# Patient Record
Sex: Male | Born: 1970 | Race: Black or African American | Hispanic: No | Marital: Married | Smoking: Never smoker
Health system: Southern US, Community
[De-identification: ages and names within clinical notes are randomized; demographics above are authoritative.]

## PROBLEM LIST (undated history)

## (undated) DIAGNOSIS — M549 Dorsalgia, unspecified: Secondary | ICD-10-CM

## (undated) DIAGNOSIS — F319 Bipolar disorder, unspecified: Secondary | ICD-10-CM

## (undated) DIAGNOSIS — F329 Major depressive disorder, single episode, unspecified: Secondary | ICD-10-CM

## (undated) DIAGNOSIS — N189 Chronic kidney disease, unspecified: Secondary | ICD-10-CM

## (undated) DIAGNOSIS — G8929 Other chronic pain: Secondary | ICD-10-CM

## (undated) DIAGNOSIS — F32A Depression, unspecified: Secondary | ICD-10-CM

## (undated) DIAGNOSIS — W3400XA Accidental discharge from unspecified firearms or gun, initial encounter: Secondary | ICD-10-CM

## (undated) DIAGNOSIS — I10 Essential (primary) hypertension: Secondary | ICD-10-CM

## (undated) DIAGNOSIS — F419 Anxiety disorder, unspecified: Secondary | ICD-10-CM

## (undated) DIAGNOSIS — E079 Disorder of thyroid, unspecified: Secondary | ICD-10-CM

## (undated) DIAGNOSIS — Y249XXA Unspecified firearm discharge, undetermined intent, initial encounter: Secondary | ICD-10-CM

## (undated) HISTORY — PX: KNEE SURGERY: SHX244

## (undated) HISTORY — PX: APPENDECTOMY: SHX54

## (undated) HISTORY — DX: Chronic kidney disease, unspecified: N18.9

## (undated) HISTORY — DX: Anxiety disorder, unspecified: F41.9

## (undated) HISTORY — DX: Bipolar disorder, unspecified: F31.9

## (undated) HISTORY — PX: OTHER SURGICAL HISTORY: SHX169

---

## 2012-06-20 ENCOUNTER — Emergency Department (HOSPITAL_COMMUNITY)
Admission: EM | Admit: 2012-06-20 | Discharge: 2012-06-20 | Disposition: A | Discharge status: Home / Self Care | Payer: BC Managed Care – HMO | Attending: Emergency Medicine | Admitting: Emergency Medicine

## 2012-06-20 ENCOUNTER — Encounter (HOSPITAL_COMMUNITY): Payer: Self-pay | Admitting: Emergency Medicine

## 2012-06-20 DIAGNOSIS — G8929 Other chronic pain: Secondary | ICD-10-CM

## 2012-06-20 DIAGNOSIS — K029 Dental caries, unspecified: Secondary | ICD-10-CM | POA: Insufficient documentation

## 2012-06-20 HISTORY — DX: Essential (primary) hypertension: I10

## 2012-06-20 MED ORDER — PENICILLIN V POTASSIUM 500 MG PO TABS: 500.0000 mg | ORAL_TABLET | Freq: Three times a day (TID) | ORAL | Status: AC

## 2012-06-20 MED ORDER — OXYCODONE-ACETAMINOPHEN 5-325 MG PO TABS: ORAL_TABLET | ORAL | Status: AC

## 2012-06-20 MED ORDER — OXYCODONE-ACETAMINOPHEN 5-325 MG PO TABS
2.0000 | ORAL_TABLET | Freq: Once | ORAL | Status: AC
  Administered 2012-06-20: 2 via ORAL
  Filled 2012-06-20: qty 2

## 2012-06-20 NOTE — ED Provider Notes (Signed)
History     CSN: [id number]  Arrival date & time 06/20/12  1023   First MD Initiated Contact with Patient 06/20/12 1058      Chief Complaint  Patient presents with  . Dental Pain    (Consider location/radiation/quality/duration/timing/severity/associated sxs/prior treatment) HPI  41 y.o. male in no acute distress complaining of exacerbation of chronic right lower tooth pain. Pain has been worsening over the course of 5 days. Denies fever gum swelling or purulent drainage. Pain is rated at 10 out of 10. He does report fever and chills starting this morning.  No past medical history on file.  No past surgical history on file.  No family history on file.  History  Substance Use Topics  . Smoking status: Not on file  . Smokeless tobacco: Not on file  . Alcohol Use: Not on file      Review of Systems  HENT: Positive for dental problem.   All other systems reviewed and are negative.    Allergies  Review of patient's allergies indicates no known allergies.  Home Medications   Current Outpatient Rx  Name Route Sig Dispense Refill  . DULOXETINE HCL 60 MG PO CPEP Oral Take 120 mg by mouth daily.      There were no vitals taken for this visit.  Physical Exam  Vitals reviewed. Constitutional: He is oriented to person, place, and time. He appears well-developed and well-nourished. No distress.  HENT:  Head: Normocephalic.  Mouth/Throat: Oropharynx is clear and moist.       Multiple dental caries and broken teeth. No erythema, swelling, or fluctuance the gums  Eyes: Conjunctivae and EOM are normal.  Cardiovascular: Normal rate.   Pulmonary/Chest: Effort normal.  Musculoskeletal: Normal range of motion.  Neurological: He is alert and oriented to person, place, and time.  Psychiatric: He has a normal mood and affect.    ED Course  Procedures (including critical care time)  Labs Reviewed - No data to display No results found.   1. Chronic dental pain        MDM  Patient with exacerbation of chronic dental pain I will give him Percocet and a resource list to followup in the dental clinic. Although you do not appreciate a phone and abscess I will start him on penicillin prophylactically. Pt verbalized understanding and agrees with care plan. Outpatient follow-up and return precautions given.           Wynetta Emery, PA-C 06/20/12 1142

## 2012-06-20 NOTE — ED Provider Notes (Signed)
Medical screening examination/treatment/procedure(s) were performed by non-physician practitioner and as supervising physician I was immediately available for consultation/collaboration.   Gwyneth Sprout, MD 06/20/12 1601

## 2012-07-12 ENCOUNTER — Emergency Department (HOSPITAL_COMMUNITY)
Admission: EM | Admit: 2012-07-12 | Discharge: 2012-07-12 | Disposition: A | Discharge status: Home / Self Care | Payer: Medicaid - Out of State | Attending: Emergency Medicine | Admitting: Emergency Medicine

## 2012-07-12 ENCOUNTER — Encounter (HOSPITAL_COMMUNITY): Payer: Self-pay | Admitting: Emergency Medicine

## 2012-07-12 DIAGNOSIS — I10 Essential (primary) hypertension: Secondary | ICD-10-CM | POA: Insufficient documentation

## 2012-07-12 DIAGNOSIS — K047 Periapical abscess without sinus: Secondary | ICD-10-CM | POA: Insufficient documentation

## 2012-07-12 DIAGNOSIS — Z8249 Family history of ischemic heart disease and other diseases of the circulatory system: Secondary | ICD-10-CM | POA: Insufficient documentation

## 2012-07-12 DIAGNOSIS — Z833 Family history of diabetes mellitus: Secondary | ICD-10-CM | POA: Insufficient documentation

## 2012-07-12 MED ORDER — TRAMADOL HCL 50 MG PO TABS: 50.0000 mg | ORAL_TABLET | Freq: Four times a day (QID) | ORAL | Status: AC | PRN

## 2012-07-12 MED ORDER — NAPROXEN 500 MG PO TABS: 500.0000 mg | ORAL_TABLET | Freq: Two times a day (BID) | ORAL | Status: DC

## 2012-07-12 MED ORDER — AMOXICILLIN 500 MG PO CAPS: 500.0000 mg | ORAL_CAPSULE | Freq: Three times a day (TID) | ORAL | Status: AC

## 2012-07-12 MED ORDER — KETOROLAC TROMETHAMINE 60 MG/2ML IM SOLN
60.0000 mg | Freq: Once | INTRAMUSCULAR | Status: AC
  Administered 2012-07-12: 60 mg via INTRAMUSCULAR
  Filled 2012-07-12: qty 2

## 2012-07-12 NOTE — ED Provider Notes (Signed)
History     CSN: [id number]  Arrival date & time 07/12/12  0037   First MD Initiated Contact with Patient 07/12/12 [phone]      Chief Complaint  Patient presents with  . Dental Pain    (Consider location/radiation/quality/duration/timing/severity/associated sxs/prior treatment) HPI Comments: Patient reports right jaw pain onset in the last several hours. He has history of poor dentition but has not had a toothache. He denies fevers chills nausea or vomiting. The symptoms are persistent, gradually worsening, not associated with neck pain or stiffness.  Patient is a 41 y.o. male presenting with tooth pain. The history is provided by the patient.  Dental PainPrimary symptoms do not include fever or sore throat.  Additional symptoms include: facial swelling. Additional symptoms do not include: trouble swallowing.    Past Medical History  Diagnosis Date  . Hypertension     History reviewed. No pertinent past surgical history.  Family History  Problem Relation Age of Onset  . Diabetes Mother   . Hypertension Mother     History  Substance Use Topics  . Smoking status: Never Smoker   . Smokeless tobacco: Not on file  . Alcohol Use: No      Review of Systems  Constitutional: Negative for fever and chills.  HENT: Positive for facial swelling and dental problem. Negative for sore throat, trouble swallowing and voice change.        Toothache  Gastrointestinal: Negative for nausea and vomiting.    Allergies  Review of patient's allergies indicates no known allergies.  Home Medications   Current Outpatient Rx  Name Route Sig Dispense Refill  . DULOXETINE HCL 60 MG PO CPEP Oral Take 120 mg by mouth 2 (two) times daily.     . IBUPROFEN 200 MG PO TABS Oral Take 800 mg by mouth every 8 (eight) hours as needed. For pain    . AMOXICILLIN 500 MG PO CAPS Oral Take 1 capsule (500 mg total) by mouth 3 (three) times daily. 30 capsule 0  . NAPROXEN 500 MG PO TABS Oral Take 1 tablet  (500 mg total) by mouth 2 (two) times daily with a meal. 30 tablet 0  . TRAMADOL HCL 50 MG PO TABS Oral Take 1 tablet (50 mg total) by mouth every 6 (six) hours as needed for pain. 15 tablet 0    BP 148/89  Pulse 71  Temp 98.1 F (36.7 C) (Oral)  Resp 20  Wt 187 lb (84.823 kg)  SpO2 100%  Physical Exam  Nursing note and vitals reviewed. Constitutional: He appears well-developed and well-nourished. No distress.  HENT:  Mouth/Throat: Oropharynx is clear and moist. No oropharyngeal exudate.       Tenderness over the right mandible, tenderness over the gums, no area of fluctuance palpated, no tenderness under the tongue, no asymmetry, normal voice and phonation, patent oropharynx without exudate asymmetry or hypertrophy of the tonsils.  Eyes: Conjunctivae and EOM are normal. Pupils are equal, round, and reactive to light. Right eye exhibits no discharge. Left eye exhibits no discharge. No scleral icterus.  Neck: Normal range of motion. Neck supple. No JVD present. No thyromegaly present.       Supple neck without lymphadenopathy or stiffness  Pulmonary/Chest: Effort normal.  Lymphadenopathy:    He has no cervical adenopathy.  Neurological: He is alert. Coordination normal.  Skin: Skin is warm and dry. No rash noted. No erythema.  Psychiatric: He has a normal mood and affect. His behavior is normal.  ED Course  Procedures (including critical care time)  Labs Reviewed - No data to display No results found.   1. Dental abscess       MDM  Patient has early dental abscess, with normal vital signs, no signs of Ludwig's angina, treat with antibiotics, pain medications followup with dental followup.  toradol given  Discharge Prescriptions include:  amox Naprosyn Ultram         Vida Roller, MD 07/12/12 [phone]

## 2012-07-12 NOTE — ED Notes (Signed)
Pt states for a month and a half he has been having a toothache  Pt states it is on the right bottom  Pt has swelling noted to the right side of his face

## 2012-09-16 ENCOUNTER — Emergency Department (HOSPITAL_COMMUNITY)
Admission: EM | Admit: 2012-09-16 | Discharge: 2012-09-16 | Disposition: A | Discharge status: Home / Self Care | Payer: Medicaid Other | Source: Home / Self Care

## 2012-09-16 ENCOUNTER — Emergency Department (HOSPITAL_COMMUNITY)
Admission: EM | Admit: 2012-09-16 | Discharge: 2012-09-16 | Disposition: A | Discharge status: Home / Self Care | Payer: Medicaid Other | Attending: Emergency Medicine | Admitting: Emergency Medicine

## 2012-09-16 ENCOUNTER — Encounter (HOSPITAL_COMMUNITY): Payer: Self-pay

## 2012-09-16 ENCOUNTER — Encounter (HOSPITAL_COMMUNITY): Payer: Self-pay | Admitting: Emergency Medicine

## 2012-09-16 DIAGNOSIS — Z7982 Long term (current) use of aspirin: Secondary | ICD-10-CM | POA: Insufficient documentation

## 2012-09-16 DIAGNOSIS — Z791 Long term (current) use of non-steroidal anti-inflammatories (NSAID): Secondary | ICD-10-CM | POA: Insufficient documentation

## 2012-09-16 DIAGNOSIS — G8921 Chronic pain due to trauma: Secondary | ICD-10-CM | POA: Insufficient documentation

## 2012-09-16 DIAGNOSIS — E079 Disorder of thyroid, unspecified: Secondary | ICD-10-CM | POA: Insufficient documentation

## 2012-09-16 DIAGNOSIS — M545 Low back pain, unspecified: Secondary | ICD-10-CM

## 2012-09-16 DIAGNOSIS — G8929 Other chronic pain: Secondary | ICD-10-CM

## 2012-09-16 DIAGNOSIS — Z79899 Other long term (current) drug therapy: Secondary | ICD-10-CM | POA: Insufficient documentation

## 2012-09-16 DIAGNOSIS — I10 Essential (primary) hypertension: Secondary | ICD-10-CM | POA: Insufficient documentation

## 2012-09-16 HISTORY — DX: Disorder of thyroid, unspecified: E07.9

## 2012-09-16 HISTORY — DX: Accidental discharge from unspecified firearms or gun, initial encounter: W34.00XA

## 2012-09-16 HISTORY — DX: Unspecified firearm discharge, undetermined intent, initial encounter: Y24.9XXA

## 2012-09-16 MED ORDER — TRAMADOL HCL 50 MG PO TABS: 50.0000 mg | ORAL_TABLET | Freq: Four times a day (QID) | ORAL | Status: DC | PRN

## 2012-09-16 MED ORDER — ACETAMINOPHEN-CODEINE #3 300-30 MG PO TABS
1.0000 | ORAL_TABLET | Freq: Four times a day (QID) | ORAL | Status: DC | PRN
Start: 2012-09-16 — End: 2013-02-09

## 2012-09-16 MED ORDER — OXYCODONE-ACETAMINOPHEN 5-325 MG PO TABS
2.0000 | ORAL_TABLET | Freq: Once | ORAL | Status: AC
  Administered 2012-09-16: 2 via ORAL
  Filled 2012-09-16: qty 2

## 2012-09-16 MED ORDER — OXYCODONE-ACETAMINOPHEN 10-325 MG PO TABS: 1.0000 | ORAL_TABLET | ORAL | Status: DC | PRN

## 2012-09-16 NOTE — ED Notes (Signed)
When in to d/c patient he became upset and told me to keep the tylenol 3# and tramadol Rx. Patient was advised to try to call the number provided to establish a relationship with a primary care provider , as to obtain help with his pain control until he can go to this clinic appointment later this month, as we do not do primary care.Was advised we have done all we are allowed to do . Pt left [facility] angry, and stated he intended to go to the ED for further evaluation and assistance.
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
## 2012-09-16 NOTE — ED Notes (Signed)
Requesting pain medication Rx. Reportedly had just moved here from [place], and has had issues w appointment for pain management here in town. States he has new appointment 11-14 w [facility]
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
## 2012-09-16 NOTE — ED Notes (Signed)
At time of d/c, patient was upset, raising his voice at this writer. " I don't want the tyenol #3, I don't want the tramadol
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
## 2012-09-16 NOTE — ED Notes (Signed)
Patient stated did not want previous discharge paper work or prescriptions from Urgent Care. Placed in Higher education careers adviser.

## 2012-09-17 NOTE — ED Provider Notes (Signed)
Medical screening examination/treatment/procedure(s) were performed by resident physician or non-physician practitioner and as supervising physician I was immediately available for consultation/collaboration.   Rashmi Tallent DOUGLAS MD.    Bailey Faiella D Gurjot Brisco, MD 09/17/12 0918 

## 2012-12-03 ENCOUNTER — Emergency Department (HOSPITAL_COMMUNITY)
Admission: EM | Admit: 2012-12-03 | Discharge: 2012-12-03 | Disposition: A | Discharge status: Home / Self Care | Payer: Medicaid Other | Attending: Emergency Medicine | Admitting: Emergency Medicine

## 2012-12-03 ENCOUNTER — Encounter (HOSPITAL_COMMUNITY): Payer: Self-pay | Admitting: *Deleted

## 2012-12-03 DIAGNOSIS — G8921 Chronic pain due to trauma: Secondary | ICD-10-CM | POA: Insufficient documentation

## 2012-12-03 DIAGNOSIS — M545 Low back pain, unspecified: Secondary | ICD-10-CM | POA: Insufficient documentation

## 2012-12-03 DIAGNOSIS — M549 Dorsalgia, unspecified: Secondary | ICD-10-CM

## 2012-12-03 DIAGNOSIS — E079 Disorder of thyroid, unspecified: Secondary | ICD-10-CM | POA: Insufficient documentation

## 2012-12-03 DIAGNOSIS — IMO0002 Reserved for concepts with insufficient information to code with codable children: Secondary | ICD-10-CM | POA: Insufficient documentation

## 2012-12-03 DIAGNOSIS — I10 Essential (primary) hypertension: Secondary | ICD-10-CM | POA: Insufficient documentation

## 2012-12-03 DIAGNOSIS — X58XXXS Exposure to other specified factors, sequela: Secondary | ICD-10-CM | POA: Insufficient documentation

## 2012-12-03 DIAGNOSIS — M5380 Other specified dorsopathies, site unspecified: Secondary | ICD-10-CM | POA: Insufficient documentation

## 2012-12-03 DIAGNOSIS — Z79899 Other long term (current) drug therapy: Secondary | ICD-10-CM | POA: Insufficient documentation

## 2012-12-03 DIAGNOSIS — G8929 Other chronic pain: Secondary | ICD-10-CM

## 2012-12-03 MED ORDER — OXYCODONE-ACETAMINOPHEN 10-325 MG PO TABS: 1.0000 | ORAL_TABLET | ORAL | Status: DC | PRN

## 2012-12-03 MED ORDER — OXYCODONE-ACETAMINOPHEN 5-325 MG PO TABS
2.0000 | ORAL_TABLET | Freq: Once | ORAL | Status: AC
  Administered 2012-12-03: 2 via ORAL
  Filled 2012-12-03: qty 2
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
## 2012-12-03 NOTE — ED Notes (Signed)
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

## 2012-12-03 NOTE — ED Notes (Signed)
Patient moved to area about 3 months ago, was seeing a pain management specialist in [place] and has continued to receive treatments there since moving to this area. Patient has been taking 30mg  Oxycodone QID and 120mg  Cymbalta BID, and was recently given 3 new medications (Flexeril and 2 additional medications he is unable to name). Patient states it has been a "hassle" to get treatment here due to insurance. Patient states he was unable to fill his medications that were most recently prescribed due to the costs. Patient states he has an appointment with a new PCP on 12/07/12.  Patient c/o low back pain.

## 2012-12-04 NOTE — ED Provider Notes (Signed)
Medical screening examination/treatment/procedure(s) were performed by non-physician practitioner and as supervising physician I was immediately available for consultation/collaboration.   Zarielle Cea L Lakeyn Dokken, MD 12/04/12 0038 

## 2013-02-09 ENCOUNTER — Emergency Department (HOSPITAL_COMMUNITY)
Admission: EM | Admit: 2013-02-09 | Discharge: 2013-02-09 | Disposition: A | Discharge status: Home / Self Care | Payer: Medicaid Other | Attending: Emergency Medicine | Admitting: Emergency Medicine

## 2013-02-09 ENCOUNTER — Encounter (HOSPITAL_COMMUNITY): Payer: Self-pay | Admitting: *Deleted

## 2013-02-09 DIAGNOSIS — T783XXA Angioneurotic edema, initial encounter: Secondary | ICD-10-CM

## 2013-02-09 DIAGNOSIS — T465X5A Adverse effect of other antihypertensive drugs, initial encounter: Secondary | ICD-10-CM | POA: Insufficient documentation

## 2013-02-09 DIAGNOSIS — Z87828 Personal history of other (healed) physical injury and trauma: Secondary | ICD-10-CM | POA: Insufficient documentation

## 2013-02-09 DIAGNOSIS — I10 Essential (primary) hypertension: Secondary | ICD-10-CM | POA: Insufficient documentation

## 2013-02-09 DIAGNOSIS — E079 Disorder of thyroid, unspecified: Secondary | ICD-10-CM | POA: Insufficient documentation

## 2013-02-09 DIAGNOSIS — R220 Localized swelling, mass and lump, head: Secondary | ICD-10-CM | POA: Insufficient documentation

## 2013-02-09 DIAGNOSIS — Z79899 Other long term (current) drug therapy: Secondary | ICD-10-CM | POA: Insufficient documentation

## 2013-02-09 DIAGNOSIS — R21 Rash and other nonspecific skin eruption: Secondary | ICD-10-CM | POA: Insufficient documentation

## 2013-02-09 MED ORDER — SODIUM CHLORIDE 0.9 % IV BOLUS (SEPSIS)
500.0000 mL | Freq: Once | INTRAVENOUS | Status: AC
  Administered 2013-02-09: 500 mL via INTRAVENOUS

## 2013-02-09 MED ORDER — FAMOTIDINE IN NACL 20-0.9 MG/50ML-% IV SOLN
20.0000 mg | Freq: Once | INTRAVENOUS | Status: AC
  Administered 2013-02-09: 20 mg via INTRAVENOUS
  Filled 2013-02-09: qty 50

## 2013-02-09 MED ORDER — PREDNISONE 20 MG PO TABS
60.0000 mg | ORAL_TABLET | Freq: Once | ORAL | Status: AC
  Administered 2013-02-09: 60 mg via ORAL
  Filled 2013-02-09: qty 3

## 2013-02-09 MED ORDER — DIPHENHYDRAMINE HCL 50 MG/ML IJ SOLN
25.0000 mg | Freq: Once | INTRAMUSCULAR | Status: AC
Start: 2013-02-09 — End: 2013-02-09
  Administered 2013-02-09: 25 mg via INTRAVENOUS
  Filled 2013-02-09: qty 1

## 2013-02-09 MED ORDER — SODIUM CHLORIDE 0.9 % IV SOLN: INTRAVENOUS | Status: DC

## 2013-02-09 NOTE — ED Provider Notes (Signed)
Medical screening examination/treatment/procedure(s) were performed by non-physician practitioner and as supervising physician I was immediately available for consultation/collaboration.   David H Yao, MD 02/09/13 2055 
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
## 2013-02-09 NOTE — ED Notes (Signed)
Pt states started a new blood pressure medication on Monday, states this morning started having swelling in his lips, took benadryl, did not help, states lips and mouth is swollen, states mouth is very painful, denies difficulty breathing. Pt refusing to stick tongue out d/t pain.

## 2013-04-12 ENCOUNTER — Other Ambulatory Visit: Payer: Self-pay | Admitting: Pain Medicine

## 2013-04-12 DIAGNOSIS — M545 Low back pain, unspecified: Secondary | ICD-10-CM

## 2013-04-18 ENCOUNTER — Other Ambulatory Visit: Payer: BC Managed Care – HMO

## 2013-04-21 ENCOUNTER — Inpatient Hospital Stay
Admission: RE | Admit: 2013-04-21 | Discharge: 2013-04-21 | Disposition: A | Discharge status: Home / Self Care | Payer: BC Managed Care – HMO | Source: Ambulatory Visit | Attending: Pain Medicine | Admitting: Pain Medicine

## 2013-04-27 ENCOUNTER — Ambulatory Visit
Admission: RE | Admit: 2013-04-27 | Discharge: 2013-04-27 | Disposition: A | Discharge status: Home / Self Care | Payer: Medicaid Other | Source: Ambulatory Visit | Attending: Pain Medicine | Admitting: Pain Medicine

## 2013-04-27 DIAGNOSIS — M545 Low back pain, unspecified: Secondary | ICD-10-CM

## 2014-10-22 DIAGNOSIS — G8929 Other chronic pain: Secondary | ICD-10-CM | POA: Insufficient documentation

## 2015-04-14 ENCOUNTER — Emergency Department (HOSPITAL_COMMUNITY)
Admission: EM | Admit: 2015-04-14 | Discharge: 2015-04-14 | Discharge status: Against Medical Advice | Payer: Medicaid Other | Source: Home / Self Care

## 2015-04-14 ENCOUNTER — Encounter (HOSPITAL_COMMUNITY): Payer: Self-pay | Admitting: Emergency Medicine

## 2015-04-14 ENCOUNTER — Emergency Department (HOSPITAL_COMMUNITY)
Admission: EM | Admit: 2015-04-14 | Discharge: 2015-04-14 | Disposition: A | Discharge status: Home / Self Care | Payer: Medicaid Other | Attending: Emergency Medicine | Admitting: Emergency Medicine

## 2015-04-14 ENCOUNTER — Encounter (HOSPITAL_COMMUNITY): Payer: Self-pay | Admitting: *Deleted

## 2015-04-14 DIAGNOSIS — G8929 Other chronic pain: Secondary | ICD-10-CM | POA: Insufficient documentation

## 2015-04-14 DIAGNOSIS — I10 Essential (primary) hypertension: Secondary | ICD-10-CM

## 2015-04-14 DIAGNOSIS — Z79899 Other long term (current) drug therapy: Secondary | ICD-10-CM | POA: Insufficient documentation

## 2015-04-14 DIAGNOSIS — Z8639 Personal history of other endocrine, nutritional and metabolic disease: Secondary | ICD-10-CM | POA: Diagnosis not present

## 2015-04-14 DIAGNOSIS — R61 Generalized hyperhidrosis: Secondary | ICD-10-CM | POA: Insufficient documentation

## 2015-04-14 DIAGNOSIS — Z87828 Personal history of other (healed) physical injury and trauma: Secondary | ICD-10-CM | POA: Insufficient documentation

## 2015-04-14 DIAGNOSIS — M546 Pain in thoracic spine: Secondary | ICD-10-CM | POA: Insufficient documentation

## 2015-04-14 DIAGNOSIS — R1084 Generalized abdominal pain: Secondary | ICD-10-CM | POA: Diagnosis not present

## 2015-04-14 DIAGNOSIS — M549 Dorsalgia, unspecified: Secondary | ICD-10-CM

## 2015-04-14 DIAGNOSIS — M545 Low back pain: Secondary | ICD-10-CM | POA: Insufficient documentation

## 2015-04-14 MED ORDER — KETOROLAC TROMETHAMINE 60 MG/2ML IM SOLN
60.0000 mg | Freq: Once | INTRAMUSCULAR | Status: AC
  Administered 2015-04-14: 60 mg via INTRAMUSCULAR
  Filled 2015-04-14: qty 2

## 2015-04-14 MED ORDER — METHOCARBAMOL 500 MG PO TABS: 500.0000 mg | ORAL_TABLET | Freq: Two times a day (BID) | ORAL | Status: DC

## 2015-04-14 MED ORDER — MELOXICAM 15 MG PO TABS: 15.0000 mg | ORAL_TABLET | Freq: Every day | ORAL | Status: DC

## 2015-04-14 MED ORDER — HYDROMORPHONE HCL 1 MG/ML IJ SOLN
1.0000 mg | Freq: Once | INTRAMUSCULAR | Status: AC
  Administered 2015-04-14: 1 mg via INTRAMUSCULAR
  Filled 2015-04-14: qty 1

## 2015-04-14 MED ORDER — HYDROMORPHONE HCL 2 MG/ML IJ SOLN
2.0000 mg | Freq: Once | INTRAMUSCULAR | Status: DC
  Filled 2015-04-14: qty 1

## 2015-04-14 MED ORDER — METHOCARBAMOL 500 MG PO TABS
500.0000 mg | ORAL_TABLET | Freq: Once | ORAL | Status: AC
  Administered 2015-04-14: 500 mg via ORAL
  Filled 2015-04-14: qty 1

## 2015-04-14 NOTE — Discharge Instructions (Signed)
Follow up with your doctor for further evaluation of your pain.

## 2015-04-14 NOTE — ED Notes (Signed)
Pt left [facility] after triage. He did not inform the RN he was leaving. [facility] stated we have to d/c him out of our system so they can admit him there. Will d/c pt out at this time.
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
## 2015-08-29 ENCOUNTER — Emergency Department (HOSPITAL_COMMUNITY)
Admission: EM | Admit: 2015-08-29 | Discharge: 2015-08-30 | Disposition: A | Discharge status: Other Acute Inpatient Hospital | Payer: Medicaid Other | Attending: Emergency Medicine | Admitting: Emergency Medicine

## 2015-08-29 ENCOUNTER — Encounter (HOSPITAL_COMMUNITY): Payer: Self-pay

## 2015-08-29 DIAGNOSIS — Z79899 Other long term (current) drug therapy: Secondary | ICD-10-CM | POA: Diagnosis not present

## 2015-08-29 DIAGNOSIS — I10 Essential (primary) hypertension: Secondary | ICD-10-CM | POA: Diagnosis not present

## 2015-08-29 DIAGNOSIS — F329 Major depressive disorder, single episode, unspecified: Secondary | ICD-10-CM | POA: Insufficient documentation

## 2015-08-29 DIAGNOSIS — Z87828 Personal history of other (healed) physical injury and trauma: Secondary | ICD-10-CM | POA: Diagnosis not present

## 2015-08-29 DIAGNOSIS — R45851 Suicidal ideations: Secondary | ICD-10-CM

## 2015-08-29 DIAGNOSIS — T1491 Suicide attempt: Secondary | ICD-10-CM | POA: Diagnosis present

## 2015-08-29 DIAGNOSIS — G8929 Other chronic pain: Secondary | ICD-10-CM | POA: Insufficient documentation

## 2015-08-29 DIAGNOSIS — Z8639 Personal history of other endocrine, nutritional and metabolic disease: Secondary | ICD-10-CM | POA: Diagnosis not present

## 2015-08-29 DIAGNOSIS — Z791 Long term (current) use of non-steroidal anti-inflammatories (NSAID): Secondary | ICD-10-CM | POA: Insufficient documentation

## 2015-08-29 HISTORY — DX: Dorsalgia, unspecified: M54.9

## 2015-08-29 HISTORY — DX: Major depressive disorder, single episode, unspecified: F32.9

## 2015-08-29 HISTORY — DX: Depression, unspecified: F32.A

## 2015-08-29 HISTORY — DX: Other chronic pain: G89.29

## 2015-08-29 LAB — SALICYLATE LEVEL: Salicylate Lvl: <4 mg/dL (ref 2.8–30.0)

## 2015-08-29 LAB — COMPREHENSIVE METABOLIC PANEL
ALBUMIN: 5.1 g/dL — AB (ref 3.5–5.0)
ALK PHOS: 62 U/L (ref 38–126)
ALT: 16 U/L — AB (ref 17–63)
AST: 35 U/L (ref 15–41)
Anion gap: 10 (ref 5–15)
BILIRUBIN TOTAL: 0.7 mg/dL (ref 0.3–1.2)
BUN: 16 mg/dL (ref 6–20)
CO2: 28 mmol/L (ref 22–32)
CREATININE: 1.52 mg/dL — AB (ref 0.61–1.24)
Calcium: 10.3 mg/dL (ref 8.9–10.3)
Chloride: 104 mmol/L (ref 101–111)
GFR calc Af Amer: >60 mL/min (ref >60)
GFR, EST NON AFRICAN AMERICAN: 55 mL/min — AB (ref >60)
GLUCOSE: 144 mg/dL — AB (ref 65–99)
Potassium: 4.2 mmol/L (ref 3.5–5.1)
Sodium: 142 mmol/L (ref 135–145)
TOTAL PROTEIN: 9.5 g/dL — AB (ref 6.5–8.1)

## 2015-08-29 LAB — RAPID URINE DRUG SCREEN, HOSP PERFORMED
AMPHETAMINES: NOT DETECTED
BARBITURATES: NOT DETECTED
BENZODIAZEPINES: POSITIVE — AB
COCAINE: NOT DETECTED
Opiates: NOT DETECTED
Tetrahydrocannabinol: POSITIVE — AB

## 2015-08-29 LAB — CBC
HEMATOCRIT: 42.8 % (ref 39.0–52.0)
HEMOGLOBIN: 14.3 g/dL (ref 13.0–17.0)
MCH: 23.7 pg — ABNORMAL LOW (ref 26.0–34.0)
MCHC: 33.4 g/dL (ref 30.0–36.0)
MCV: 70.9 fL — ABNORMAL LOW (ref 78.0–100.0)
Platelets: 326 10*3/uL (ref 150–400)
RBC: 6.04 MIL/uL — AB (ref 4.22–5.81)
RDW: 16 % — AB (ref 11.5–15.5)
WBC: 13.4 10*3/uL — AB (ref 4.0–10.5)

## 2015-08-29 LAB — ACETAMINOPHEN LEVEL

## 2015-08-29 LAB — ETHANOL: Alcohol, Ethyl (B): <5 mg/dL (ref <5)

## 2015-08-29 MED ORDER — MIRTAZAPINE 30 MG PO TABS
30.0000 mg | ORAL_TABLET | Freq: Every day | ORAL | Status: DC
  Administered 2015-08-29: 30 mg via ORAL
  Filled 2015-08-29: qty 1

## 2015-08-29 MED ORDER — METHOCARBAMOL 500 MG PO TABS
500.0000 mg | ORAL_TABLET | Freq: Once | ORAL | Status: AC
  Administered 2015-08-29: 500 mg via ORAL
  Filled 2015-08-29: qty 1

## 2015-08-29 MED ORDER — NICOTINE 21 MG/24HR TD PT24: 21.0000 mg | MEDICATED_PATCH | Freq: Every day | TRANSDERMAL | Status: DC

## 2015-08-29 MED ORDER — IBUPROFEN 200 MG PO TABS
600.0000 mg | ORAL_TABLET | Freq: Three times a day (TID) | ORAL | Status: DC | PRN
  Administered 2015-08-29 – 2015-08-30 (×2): 600 mg via ORAL
  Filled 2015-08-29 (×2): qty 3

## 2015-08-29 MED ORDER — ALUM & MAG HYDROXIDE-SIMETH 200-200-20 MG/5ML PO SUSP: 30.0000 mL | ORAL | Status: DC | PRN

## 2015-08-29 MED ORDER — ACETAMINOPHEN 325 MG PO TABS
650.0000 mg | ORAL_TABLET | ORAL | Status: DC | PRN
  Administered 2015-08-30: 650 mg via ORAL
  Filled 2015-08-29: qty 2

## 2015-08-29 MED ORDER — LORAZEPAM 1 MG PO TABS
1.0000 mg | ORAL_TABLET | Freq: Three times a day (TID) | ORAL | Status: DC | PRN
  Administered 2015-08-29 – 2015-08-30 (×2): 1 mg via ORAL
  Filled 2015-08-29 (×2): qty 1

## 2015-08-29 NOTE — ED Notes (Signed)
Family has patient's belongings. 
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
## 2015-08-29 NOTE — BH Assessment (Signed)
Consulted with Assunta FoundShuvon Rankin, NP who recommends inpatient treatment at this time. Informed EDP and patients nurse.   Davina PokeJoVea Meziah Blasingame, LCSW Therapeutic Triage Specialist [facility] 08/29/2015 4:17 PM
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
## 2015-08-29 NOTE — BH Assessment (Signed)
Spoke with EDP who states that he will fill out proper paperwork to IVC patient.

## 2015-08-29 NOTE — ED Notes (Addendum)
Patient reports prior to coming to [place], he was taking Cymbalta, Lyrica, Naproxen, Oxycodone, and Flexeril. Does not feel that Remeron, which he states he Started in January, is helping him.

## 2015-08-29 NOTE — ED Notes (Signed)
Pt refusing to change into paper scrubs.  Stated "I will if the doctor tells me to.  I know what people think about you in those scrubs.  I came here by myself.  They didn't make me."  [employer] remains @ BS.

## 2015-08-29 NOTE — ED Notes (Signed)
Bed: WBH36 Expected date:  Expected time:  Means of arrival:  Comments: Triage 4 
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
## 2015-08-29 NOTE — Progress Notes (Signed)
Patient [facility] referral for review. [facility] - [facility] Diane, fax referral for review. [facility] - per Darl PikesSusan, d/cs on 10/21, fax referral for tomorrow. [facility] - per intake, fax referral for review.  [facility], [facility], and [facility]'t [facility] adult [facility].  [facility]: [facility] - per Olga MillersLary, call Darlene at 8:30am tomorrow, "Big day for discharges tomorrow". [facility]  [facility] [place], [place] Disposition staff 08/29/2015 6:26 PM

## 2015-08-29 NOTE — ED Notes (Signed)
Patient appears drowsy. Continues to complain of pain.
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
## 2015-08-29 NOTE — ED Notes (Signed)
EDP reports that he would like [employer] to stay and speak w/ TTS.

## 2015-08-29 NOTE — ED Notes (Signed)
EDP reports that he will be filling out IVC paperwork.
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
## 2015-08-29 NOTE — ED Notes (Signed)
Per [employer], tried to hang himself today-states history of Bi-polar and depression--per EMS he denies SI-recent death in family

## 2015-08-29 NOTE — ED Notes (Signed)
TTS at bedside. 

## 2015-08-29 NOTE — ED Notes (Signed)
[facility] contacted reports pt has rx for remeron 30 mg at bedtime and ibuprofen 800mg  filled on 04/21/15.  Tramadol 50 mg filled in 5/16. Lopressor 50mg  bid filled 3/16.
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
## 2015-08-30 MED ORDER — METHOCARBAMOL 500 MG PO TABS
500.0000 mg | ORAL_TABLET | Freq: Once | ORAL | Status: AC
  Administered 2015-08-30: 500 mg via ORAL
  Filled 2015-08-30: qty 1

## 2015-08-30 NOTE — ED Notes (Signed)
Pt Discharged [employer] with [employer].  Pt denied SI and HI.  Pt was limping due to chronic back pain but was in no acute distress.

## 2015-08-30 NOTE — BH Assessment (Signed)
Pt has been accepted to [facility] to the services of Dr. Enedina FinnerGoli. Nurse to nurse report # [phone]. Pt can be transported after 7am.

## 2015-08-30 NOTE — ED Notes (Signed)
Alert. Asking for additional blankets and what else is available for his pain.

## 2016-11-24 DIAGNOSIS — F3130 Bipolar disorder, current episode depressed, mild or moderate severity, unspecified: Secondary | ICD-10-CM | POA: Diagnosis not present

## 2016-11-24 DIAGNOSIS — Z79899 Other long term (current) drug therapy: Secondary | ICD-10-CM | POA: Diagnosis not present

## 2016-11-24 DIAGNOSIS — F4312 Post-traumatic stress disorder, chronic: Secondary | ICD-10-CM | POA: Diagnosis not present

## 2016-12-08 DIAGNOSIS — F4312 Post-traumatic stress disorder, chronic: Secondary | ICD-10-CM | POA: Diagnosis not present

## 2016-12-08 DIAGNOSIS — F3130 Bipolar disorder, current episode depressed, mild or moderate severity, unspecified: Secondary | ICD-10-CM | POA: Diagnosis not present

## 2016-12-08 DIAGNOSIS — R110 Nausea: Secondary | ICD-10-CM | POA: Diagnosis not present

## 2017-08-26 ENCOUNTER — Emergency Department (HOSPITAL_COMMUNITY)
Admission: EM | Admit: 2017-08-26 | Discharge: 2017-08-26 | Disposition: A | Discharge status: Home / Self Care | Payer: Medicaid Other | Attending: Emergency Medicine | Admitting: Emergency Medicine

## 2017-08-26 ENCOUNTER — Encounter (HOSPITAL_COMMUNITY): Payer: Self-pay

## 2017-08-26 ENCOUNTER — Emergency Department (HOSPITAL_COMMUNITY): Payer: Medicaid Other

## 2017-08-26 DIAGNOSIS — W3400XS Accidental discharge from unspecified firearms or gun, sequela: Secondary | ICD-10-CM | POA: Insufficient documentation

## 2017-08-26 DIAGNOSIS — G8929 Other chronic pain: Secondary | ICD-10-CM | POA: Diagnosis not present

## 2017-08-26 DIAGNOSIS — I10 Essential (primary) hypertension: Secondary | ICD-10-CM | POA: Insufficient documentation

## 2017-08-26 DIAGNOSIS — M545 Low back pain: Secondary | ICD-10-CM | POA: Insufficient documentation

## 2017-08-26 DIAGNOSIS — Z79899 Other long term (current) drug therapy: Secondary | ICD-10-CM | POA: Diagnosis not present

## 2017-08-26 DIAGNOSIS — R202 Paresthesia of skin: Secondary | ICD-10-CM | POA: Diagnosis not present

## 2017-08-26 DIAGNOSIS — R200 Anesthesia of skin: Secondary | ICD-10-CM | POA: Insufficient documentation

## 2017-08-26 LAB — CBC WITH DIFFERENTIAL/PLATELET
Basophils Absolute: 0 10*3/uL (ref 0.0–0.1)
Basophils Relative: 1 %
EOS PCT: 2 %
Eosinophils Absolute: 0.1 10*3/uL (ref 0.0–0.7)
HCT: 33.4 % — ABNORMAL LOW (ref 39.0–52.0)
Hemoglobin: 11.1 g/dL — ABNORMAL LOW (ref 13.0–17.0)
Lymphocytes Relative: 28 %
Lymphs Abs: 2.1 10*3/uL (ref 0.7–4.0)
MCH: 23.4 pg — AB (ref 26.0–34.0)
MCHC: 33.2 g/dL (ref 30.0–36.0)
MCV: 70.5 fL — AB (ref 78.0–100.0)
MONO ABS: 0.5 10*3/uL (ref 0.1–1.0)
MONOS PCT: 7 %
Neutro Abs: 4.7 10*3/uL (ref 1.7–7.7)
Neutrophils Relative %: 62 %
PLATELETS: 262 10*3/uL (ref 150–400)
RBC: 4.74 MIL/uL (ref 4.22–5.81)
RDW: 15.7 % — AB (ref 11.5–15.5)
WBC: 7.5 10*3/uL (ref 4.0–10.5)

## 2017-08-26 LAB — BASIC METABOLIC PANEL
Anion gap: 7 (ref 5–15)
BUN: 11 mg/dL (ref 6–20)
CO2: 26 mmol/L (ref 22–32)
Calcium: 9 mg/dL (ref 8.9–10.3)
Chloride: 105 mmol/L (ref 101–111)
Creatinine, Ser: 1.22 mg/dL (ref 0.61–1.24)
GFR calc Af Amer: >60 mL/min (ref >60)
GLUCOSE: 92 mg/dL (ref 65–99)
Potassium: 3.9 mmol/L (ref 3.5–5.1)
Sodium: 138 mmol/L (ref 135–145)

## 2017-08-26 MED ORDER — CYCLOBENZAPRINE HCL 10 MG PO TABS: 10.0000 mg | ORAL_TABLET | Freq: Two times a day (BID) | ORAL | 0 refills | Status: DC | PRN

## 2017-08-26 MED ORDER — IOPAMIDOL (ISOVUE-300) INJECTION 61%
100.0000 mL | Freq: Once | INTRAVENOUS | Status: AC | PRN
  Administered 2017-08-26: 100 mL via INTRAVENOUS

## 2017-08-26 MED ORDER — LIDOCAINE 5 % EX PTCH: 1.0000 | MEDICATED_PATCH | CUTANEOUS | 0 refills | Status: DC

## 2017-08-26 MED ORDER — MORPHINE SULFATE (PF) 4 MG/ML IV SOLN
4.0000 mg | Freq: Once | INTRAVENOUS | Status: AC
  Administered 2017-08-26: 4 mg via INTRAVENOUS
  Filled 2017-08-26: qty 1

## 2017-08-26 MED ORDER — IOPAMIDOL (ISOVUE-300) INJECTION 61%
INTRAVENOUS | Status: AC
  Filled 2017-08-26: qty 100

## 2017-08-26 MED ORDER — OXYCODONE-ACETAMINOPHEN 5-325 MG PO TABS
1.0000 | ORAL_TABLET | Freq: Once | ORAL | Status: AC
  Administered 2017-08-26: 1 via ORAL
  Filled 2017-08-26: qty 1
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
## 2018-05-17 DIAGNOSIS — F4312 Post-traumatic stress disorder, chronic: Secondary | ICD-10-CM | POA: Diagnosis not present

## 2018-05-17 DIAGNOSIS — F310 Bipolar disorder, current episode hypomanic: Secondary | ICD-10-CM | POA: Diagnosis not present

## 2018-05-31 DIAGNOSIS — F310 Bipolar disorder, current episode hypomanic: Secondary | ICD-10-CM | POA: Diagnosis not present

## 2018-05-31 DIAGNOSIS — F4312 Post-traumatic stress disorder, chronic: Secondary | ICD-10-CM | POA: Diagnosis not present

## 2018-05-31 DIAGNOSIS — R030 Elevated blood-pressure reading, without diagnosis of hypertension: Secondary | ICD-10-CM | POA: Diagnosis not present

## 2018-05-31 DIAGNOSIS — N521 Erectile dysfunction due to diseases classified elsewhere: Secondary | ICD-10-CM | POA: Diagnosis not present

## 2018-06-17 DIAGNOSIS — E559 Vitamin D deficiency, unspecified: Secondary | ICD-10-CM | POA: Diagnosis not present

## 2018-06-17 DIAGNOSIS — I10 Essential (primary) hypertension: Secondary | ICD-10-CM | POA: Diagnosis not present

## 2018-06-17 DIAGNOSIS — E611 Iron deficiency: Secondary | ICD-10-CM | POA: Diagnosis not present

## 2018-06-17 DIAGNOSIS — R634 Abnormal weight loss: Secondary | ICD-10-CM | POA: Diagnosis not present

## 2018-07-07 DIAGNOSIS — F4312 Post-traumatic stress disorder, chronic: Secondary | ICD-10-CM | POA: Diagnosis not present

## 2018-07-07 DIAGNOSIS — R21 Rash and other nonspecific skin eruption: Secondary | ICD-10-CM | POA: Diagnosis not present

## 2018-07-26 DIAGNOSIS — Z6824 Body mass index (BMI) 24.0-24.9, adult: Secondary | ICD-10-CM | POA: Diagnosis not present

## 2018-07-26 DIAGNOSIS — F310 Bipolar disorder, current episode hypomanic: Secondary | ICD-10-CM | POA: Diagnosis not present

## 2018-07-26 DIAGNOSIS — F4312 Post-traumatic stress disorder, chronic: Secondary | ICD-10-CM | POA: Diagnosis not present

## 2018-08-09 DIAGNOSIS — F4312 Post-traumatic stress disorder, chronic: Secondary | ICD-10-CM | POA: Diagnosis not present

## 2018-08-09 DIAGNOSIS — F310 Bipolar disorder, current episode hypomanic: Secondary | ICD-10-CM | POA: Diagnosis not present

## 2018-08-09 DIAGNOSIS — Z6824 Body mass index (BMI) 24.0-24.9, adult: Secondary | ICD-10-CM | POA: Diagnosis not present

## 2018-09-23 DIAGNOSIS — F310 Bipolar disorder, current episode hypomanic: Secondary | ICD-10-CM | POA: Diagnosis not present

## 2018-09-23 DIAGNOSIS — Z79899 Other long term (current) drug therapy: Secondary | ICD-10-CM | POA: Diagnosis not present

## 2018-09-26 DIAGNOSIS — Z79899 Other long term (current) drug therapy: Secondary | ICD-10-CM | POA: Diagnosis not present

## 2018-11-10 ENCOUNTER — Emergency Department (HOSPITAL_COMMUNITY)
Admission: EM | Admit: 2018-11-10 | Discharge: 2018-11-10 | Disposition: A | Discharge status: Home / Self Care | Payer: Medicaid Other | Attending: Emergency Medicine | Admitting: Emergency Medicine

## 2018-11-10 ENCOUNTER — Other Ambulatory Visit: Payer: Self-pay

## 2018-11-10 DIAGNOSIS — M545 Low back pain: Secondary | ICD-10-CM | POA: Diagnosis not present

## 2018-11-10 DIAGNOSIS — G8929 Other chronic pain: Secondary | ICD-10-CM

## 2018-11-10 DIAGNOSIS — I10 Essential (primary) hypertension: Secondary | ICD-10-CM | POA: Diagnosis not present

## 2018-11-10 DIAGNOSIS — Z79899 Other long term (current) drug therapy: Secondary | ICD-10-CM | POA: Insufficient documentation

## 2018-11-10 MED ORDER — LIDOCAINE 5 % EX PTCH: 1.0000 | MEDICATED_PATCH | CUTANEOUS | Status: DC

## 2018-11-10 MED ORDER — MELOXICAM 7.5 MG PO TABS: 7.5000 mg | ORAL_TABLET | Freq: Every day | ORAL | 0 refills | Status: DC
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
## 2018-11-10 NOTE — Discharge Instructions (Addendum)
Apply topical lidocaine patches to area for local pain relief. Take Tylenol and Meloxicam for pain as needed not relieved by topical pain reliever.   Follow up with general surgery to schedule removal of the foreign body in your left buttock area.

## 2018-11-10 NOTE — ED Notes (Signed)
When going to discharge patient he stated he thought he had something for pain. Verified that there was an order for Lidoderm patch. Informed patient I would go get the Lidoderm patch, he refused. Requesting something else for pain. Spoke with provider, who stated she could not write for a script of narcotics for this chronic pain. Relayed the information back to the patient, he got even more upset. Stating this is how black people are treated and how he was going to talk to the news or radio about his care. Also, he stated he would have wanted to have some Tylenol or Motrin. Informed patient I could ask for this order, but he stated he only wanted his papers and to leave.
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
## 2018-12-21 DIAGNOSIS — S20459A Superficial foreign body of unspecified back wall of thorax, initial encounter: Secondary | ICD-10-CM | POA: Diagnosis not present

## 2018-12-27 DIAGNOSIS — F320 Major depressive disorder, single episode, mild: Secondary | ICD-10-CM | POA: Diagnosis not present

## 2018-12-27 DIAGNOSIS — D582 Other hemoglobinopathies: Secondary | ICD-10-CM | POA: Diagnosis not present

## 2018-12-27 DIAGNOSIS — E782 Mixed hyperlipidemia: Secondary | ICD-10-CM | POA: Diagnosis not present

## 2018-12-27 DIAGNOSIS — F310 Bipolar disorder, current episode hypomanic: Secondary | ICD-10-CM | POA: Diagnosis not present

## 2019-03-10 DIAGNOSIS — E039 Hypothyroidism, unspecified: Secondary | ICD-10-CM | POA: Diagnosis not present

## 2019-03-14 DIAGNOSIS — I10 Essential (primary) hypertension: Secondary | ICD-10-CM | POA: Diagnosis not present

## 2019-03-14 DIAGNOSIS — Z79899 Other long term (current) drug therapy: Secondary | ICD-10-CM | POA: Diagnosis not present

## 2019-03-28 DIAGNOSIS — F332 Major depressive disorder, recurrent severe without psychotic features: Secondary | ICD-10-CM | POA: Diagnosis not present

## 2019-06-27 DIAGNOSIS — F310 Bipolar disorder, current episode hypomanic: Secondary | ICD-10-CM | POA: Diagnosis not present

## 2019-06-28 DIAGNOSIS — H20012 Primary iridocyclitis, left eye: Secondary | ICD-10-CM | POA: Diagnosis not present

## 2019-07-13 DIAGNOSIS — I10 Essential (primary) hypertension: Secondary | ICD-10-CM | POA: Diagnosis not present

## 2019-07-13 DIAGNOSIS — E039 Hypothyroidism, unspecified: Secondary | ICD-10-CM | POA: Diagnosis not present

## 2019-08-02 DIAGNOSIS — E785 Hyperlipidemia, unspecified: Secondary | ICD-10-CM | POA: Diagnosis not present

## 2019-08-02 DIAGNOSIS — F319 Bipolar disorder, unspecified: Secondary | ICD-10-CM | POA: Diagnosis not present

## 2019-08-02 DIAGNOSIS — R634 Abnormal weight loss: Secondary | ICD-10-CM | POA: Diagnosis not present

## 2019-08-02 DIAGNOSIS — F329 Major depressive disorder, single episode, unspecified: Secondary | ICD-10-CM | POA: Diagnosis not present

## 2019-08-02 DIAGNOSIS — N183 Chronic kidney disease, stage 3 (moderate): Secondary | ICD-10-CM | POA: Diagnosis not present

## 2019-08-02 DIAGNOSIS — I129 Hypertensive chronic kidney disease with stage 1 through stage 4 chronic kidney disease, or unspecified chronic kidney disease: Secondary | ICD-10-CM | POA: Diagnosis not present

## 2019-08-03 DIAGNOSIS — F310 Bipolar disorder, current episode hypomanic: Secondary | ICD-10-CM | POA: Diagnosis not present

## 2019-08-03 DIAGNOSIS — F332 Major depressive disorder, recurrent severe without psychotic features: Secondary | ICD-10-CM | POA: Diagnosis not present

## 2019-08-07 ENCOUNTER — Other Ambulatory Visit: Payer: Self-pay | Admitting: Nephrology

## 2019-08-07 DIAGNOSIS — N183 Chronic kidney disease, stage 3 unspecified: Secondary | ICD-10-CM

## 2019-08-09 ENCOUNTER — Ambulatory Visit
Admission: RE | Admit: 2019-08-09 | Discharge: 2019-08-09 | Disposition: A | Discharge status: Home / Self Care | Payer: Medicaid Other | Source: Ambulatory Visit | Attending: Nephrology | Admitting: Nephrology

## 2019-08-09 DIAGNOSIS — N183 Chronic kidney disease, stage 3 unspecified: Secondary | ICD-10-CM

## 2019-08-14 DIAGNOSIS — F310 Bipolar disorder, current episode hypomanic: Secondary | ICD-10-CM | POA: Diagnosis not present

## 2019-09-07 DIAGNOSIS — F332 Major depressive disorder, recurrent severe without psychotic features: Secondary | ICD-10-CM | POA: Diagnosis not present

## 2019-09-07 DIAGNOSIS — F310 Bipolar disorder, current episode hypomanic: Secondary | ICD-10-CM | POA: Diagnosis not present

## 2019-09-14 ENCOUNTER — Encounter: Payer: Medicaid Other | Attending: Family | Admitting: Registered"

## 2019-09-14 ENCOUNTER — Other Ambulatory Visit: Payer: Self-pay

## 2019-09-14 DIAGNOSIS — E639 Nutritional deficiency, unspecified: Secondary | ICD-10-CM | POA: Insufficient documentation

## 2019-09-14 NOTE — Progress Notes (Addendum)
Medical Nutrition Therapy:  Appt start time: 1115 end time:  1215.   Assessment:  Primary concerns today: Unintentional weight loss, Feels weak.  Wt Readings from Last 3 Encounters:  09/14/19 167 lb 3.2 oz (75.8 kg)  07/13/19 173.2 lb (at [facility], Dr Marcellus Scott; Wilmer Floor, FNP visit)  11/10/18 196 lb (88.9 kg)  08/26/17 205 lb (93 kg)    Pt reports his UBW 212 lbs, first noticed weight loss about 1 yr ago with ring loose on finger. Pt states about that time he lost his appetite, was also having depression, with wt loss causing increase in depression. Pt states he has started counseling a couple of times but the therapists moved. He has reached out to a few resources but not receiving return phone calls. Pt states he uses a hotline to talk to someone about depression. Pt states medication is helping some, but would like to find a counselor as well.  Pt reports appetite loss continues and hasn't eaten in 2 days. Pt states he doesn't want to lose more weight so he tries to force himself to eat even when not hungry, frequently will experience nausea and vomiting 20 min after eating. Pt states cooking smells can trigger nausea/vomitting. Pt also describes reaction to eating "feels like throat is closing up."  Pt reports he was drinking Ensure to keep weight on but reports his doctor told him to stop d/t kidney issues and waiting on 2 test results, from 2 different doctors. Pt states he was told Ensure was too high in protein.  Pt states he wears several layers of clothing to feel some weight against his body.   Pt states occasionally he gets appetite back and can eat 3 good meals, then loses appetite again. Strategies pt reports is to brush tongue in the morning so he won't have a taste in his mouth can help him tolerate food first thing in the morning.   Pt reports he doesn't drink much water because when he drinks water or anything he immediately sweats it out.  Patient finds it odd  that he has these issues but he has never caught a cold.  Sleep: doesn't sleep, gets about 2 hrs sleep, his mind keeps him awake. Pt states he has medication, but has been taking for a long time and doesn't think it is helping.  PMH: Pt reports Stroke (mild) 2002  Sample provided: Boost Plus very vanilla Lot# [id number] Exp: 03/01/20  A1c labs included in referral: 09/23/18 6.0%  03/14/19 5.8%  Prediabetes is not included in in PMH or current problem list. RD did not address this directly with patient, but cautioned against using foods high in carbohydrates such as juice as a source of calories.  Preferred Learning Style:   No preference indicated   Learning Readiness:   Ready   MEDICATIONS: reviewed includign remeron   DIETARY INTAKE:  Avoided foods include fish d/t allergic reaction 20 yrs ago.  Lactose intolerant, but can tolerate yogurt, doesn't like juice.  24-hr recall: (nothing last 24 hrs, recall is typical foods when he can eat)  B ( AM): forces an egg, bacon 1/2 bread a little bit of grits (forcing it down) Snk ( AM):   L ( PM): PB sandwich Snk ( PM): goldfish, ritz crackers, plain potato chips D ( PM): chicken, green beans, mashed potatoes Snk ( PM):  Beverages: water (not much)  Usual physical activity: not assessed  Estimated energy needs: 2500-2800 calories  Progress Towards Goal(s):  New goals.   Nutritional Diagnosis:  Julian-3.2 Unintentional weight loss As related to inadequate intake d/t reduced appetite.  As evidenced by dietary recall, current weight 21% less than UBW, 18% unintentional weight loss in 1 yr; continued weight loss of 3% in past 2 months.    Intervention:  Nutrition Education. Discussed food that are higher in calories with consideration of keeping protein intake lower due to potential kidney concerns. Discussed label reading with nutritional supplements. Discussed strategies to address nausea.   Teaching Method Utilized:   Visual Auditory  Handouts given during visit include:  Sleep Hygiene  Ten Sleep for Underweight  Barriers to learning/adherence to lifestyle change: loss of appetite/depression  Demonstrated degree of understanding via:  Teach Back   Monitoring/Evaluation:  Dietary intake, exercise, and body weight prn.

## 2019-09-14 NOTE — Patient Instructions (Addendum)
Here is website to find counselors who accept Medicaid: [email or web address]-solis.[email or web address]  Tips for getting in more calories: Don't drink too much water with meals.  Use creams, sauces, butter for flavoring Try butter on your peanut butter sandwiches Nutritional drinks with ~10 g protein should be okay. Drinking something like 1 Boost plus per day should be okay. Remember to eat often  For Nausea: Ask your family to use the exhaust fan when cooking. Herbal tea with ginger, ginger chews or crystallized ginger

## 2019-09-15 ENCOUNTER — Encounter: Payer: Self-pay | Admitting: Registered"

## 2019-11-23 DIAGNOSIS — F332 Major depressive disorder, recurrent severe without psychotic features: Secondary | ICD-10-CM | POA: Diagnosis not present

## 2019-11-23 DIAGNOSIS — F310 Bipolar disorder, current episode hypomanic: Secondary | ICD-10-CM | POA: Diagnosis not present

## 2019-11-27 DIAGNOSIS — Z8349 Family history of other endocrine, nutritional and metabolic diseases: Secondary | ICD-10-CM | POA: Diagnosis not present

## 2019-11-27 DIAGNOSIS — R946 Abnormal results of thyroid function studies: Secondary | ICD-10-CM | POA: Diagnosis not present

## 2019-11-27 DIAGNOSIS — K4090 Unilateral inguinal hernia, without obstruction or gangrene, not specified as recurrent: Secondary | ICD-10-CM | POA: Diagnosis not present

## 2019-11-27 DIAGNOSIS — R634 Abnormal weight loss: Secondary | ICD-10-CM | POA: Diagnosis not present

## 2019-11-27 DIAGNOSIS — Z7189 Other specified counseling: Secondary | ICD-10-CM | POA: Diagnosis not present

## 2019-11-30 DIAGNOSIS — R634 Abnormal weight loss: Secondary | ICD-10-CM | POA: Diagnosis not present

## 2019-11-30 DIAGNOSIS — R946 Abnormal results of thyroid function studies: Secondary | ICD-10-CM | POA: Diagnosis not present

## 2019-11-30 DIAGNOSIS — Z8349 Family history of other endocrine, nutritional and metabolic diseases: Secondary | ICD-10-CM | POA: Diagnosis not present

## 2019-12-11 ENCOUNTER — Other Ambulatory Visit: Payer: Self-pay | Admitting: Internal Medicine

## 2019-12-11 DIAGNOSIS — E038 Other specified hypothyroidism: Secondary | ICD-10-CM

## 2019-12-13 DIAGNOSIS — Z8349 Family history of other endocrine, nutritional and metabolic diseases: Secondary | ICD-10-CM | POA: Diagnosis not present

## 2019-12-13 DIAGNOSIS — R634 Abnormal weight loss: Secondary | ICD-10-CM | POA: Diagnosis not present

## 2019-12-13 DIAGNOSIS — R946 Abnormal results of thyroid function studies: Secondary | ICD-10-CM | POA: Diagnosis not present

## 2019-12-21 DIAGNOSIS — R634 Abnormal weight loss: Secondary | ICD-10-CM | POA: Diagnosis not present

## 2019-12-21 DIAGNOSIS — Z8349 Family history of other endocrine, nutritional and metabolic diseases: Secondary | ICD-10-CM | POA: Diagnosis not present

## 2019-12-21 DIAGNOSIS — Z125 Encounter for screening for malignant neoplasm of prostate: Secondary | ICD-10-CM | POA: Diagnosis not present

## 2019-12-21 DIAGNOSIS — R6882 Decreased libido: Secondary | ICD-10-CM | POA: Diagnosis not present

## 2019-12-21 DIAGNOSIS — E038 Other specified hypothyroidism: Secondary | ICD-10-CM | POA: Diagnosis not present

## 2019-12-21 DIAGNOSIS — R7989 Other specified abnormal findings of blood chemistry: Secondary | ICD-10-CM | POA: Diagnosis not present

## 2020-01-12 ENCOUNTER — Ambulatory Visit
Admission: RE | Admit: 2020-01-12 | Discharge: 2020-01-12 | Disposition: A | Discharge status: Home / Self Care | Payer: Medicaid Other | Source: Ambulatory Visit | Attending: Internal Medicine | Admitting: Internal Medicine

## 2020-01-12 ENCOUNTER — Other Ambulatory Visit: Payer: Self-pay

## 2020-01-12 DIAGNOSIS — E038 Other specified hypothyroidism: Secondary | ICD-10-CM

## 2020-01-25 DIAGNOSIS — H5213 Myopia, bilateral: Secondary | ICD-10-CM | POA: Diagnosis not present

## 2020-02-03 ENCOUNTER — Ambulatory Visit
Admission: RE | Admit: 2020-02-03 | Discharge: 2020-02-03 | Disposition: A | Discharge status: Home / Self Care | Payer: Medicaid Other | Source: Ambulatory Visit | Attending: Internal Medicine | Admitting: Internal Medicine

## 2020-02-03 DIAGNOSIS — G252 Other specified forms of tremor: Secondary | ICD-10-CM | POA: Diagnosis not present

## 2020-02-03 MED ORDER — GADOBENATE DIMEGLUMINE 529 MG/ML IV SOLN
12.0000 mL | Freq: Once | INTRAVENOUS | Status: AC | PRN
  Administered 2020-02-03: 12 mL via INTRAVENOUS

## 2020-02-06 DIAGNOSIS — Z8349 Family history of other endocrine, nutritional and metabolic diseases: Secondary | ICD-10-CM | POA: Diagnosis not present

## 2020-02-06 DIAGNOSIS — R7989 Other specified abnormal findings of blood chemistry: Secondary | ICD-10-CM | POA: Diagnosis not present

## 2020-02-06 DIAGNOSIS — R6882 Decreased libido: Secondary | ICD-10-CM | POA: Diagnosis not present

## 2020-02-06 DIAGNOSIS — Z125 Encounter for screening for malignant neoplasm of prostate: Secondary | ICD-10-CM | POA: Diagnosis not present

## 2020-02-06 DIAGNOSIS — R252 Cramp and spasm: Secondary | ICD-10-CM | POA: Diagnosis not present

## 2020-02-06 DIAGNOSIS — E038 Other specified hypothyroidism: Secondary | ICD-10-CM | POA: Diagnosis not present

## 2020-02-08 DIAGNOSIS — R7989 Other specified abnormal findings of blood chemistry: Secondary | ICD-10-CM | POA: Diagnosis not present

## 2020-02-08 DIAGNOSIS — R6882 Decreased libido: Secondary | ICD-10-CM | POA: Diagnosis not present

## 2020-02-08 DIAGNOSIS — D509 Iron deficiency anemia, unspecified: Secondary | ICD-10-CM | POA: Diagnosis not present

## 2020-02-08 DIAGNOSIS — Z125 Encounter for screening for malignant neoplasm of prostate: Secondary | ICD-10-CM | POA: Diagnosis not present

## 2020-02-08 DIAGNOSIS — E038 Other specified hypothyroidism: Secondary | ICD-10-CM | POA: Diagnosis not present

## 2020-02-20 DIAGNOSIS — H52203 Unspecified astigmatism, bilateral: Secondary | ICD-10-CM | POA: Diagnosis not present

## 2020-02-28 DIAGNOSIS — K0253 Dental caries on pit and fissure surface penetrating into pulp: Secondary | ICD-10-CM | POA: Diagnosis not present

## 2020-03-26 DIAGNOSIS — F332 Major depressive disorder, recurrent severe without psychotic features: Secondary | ICD-10-CM | POA: Diagnosis not present

## 2020-03-26 DIAGNOSIS — F310 Bipolar disorder, current episode hypomanic: Secondary | ICD-10-CM | POA: Diagnosis not present

## 2020-04-05 DIAGNOSIS — Z8349 Family history of other endocrine, nutritional and metabolic diseases: Secondary | ICD-10-CM | POA: Diagnosis not present

## 2020-04-05 DIAGNOSIS — D573 Sickle-cell trait: Secondary | ICD-10-CM | POA: Diagnosis not present

## 2020-04-05 DIAGNOSIS — R7989 Other specified abnormal findings of blood chemistry: Secondary | ICD-10-CM | POA: Diagnosis not present

## 2020-04-05 DIAGNOSIS — E038 Other specified hypothyroidism: Secondary | ICD-10-CM | POA: Diagnosis not present

## 2020-04-05 DIAGNOSIS — R634 Abnormal weight loss: Secondary | ICD-10-CM | POA: Diagnosis not present

## 2020-06-13 ENCOUNTER — Telehealth: Payer: Self-pay

## 2020-06-13 NOTE — Telephone Encounter (Signed)
Patient returned call and states he informed Medicaid he was establishing  and patient stated he also has [employer] as secondary.  Patient was informed of message below

## 2020-06-13 NOTE — Telephone Encounter (Signed)
Returned call to patient and LVM informing him that the providers name does have to be on the insurance card.   Copied from CRM [id number]. Topic: General - Other >> Jun 06, 2020 10:48 AM Leafy Ro wrote: Reason for CRM:pt will be a new pt with [facility] on 08/19/2020. Pt has medicaid [employer] does provider name has to be on the card. Please advice

## 2020-07-02 DIAGNOSIS — E785 Hyperlipidemia, unspecified: Secondary | ICD-10-CM | POA: Diagnosis not present

## 2020-07-02 DIAGNOSIS — F319 Bipolar disorder, unspecified: Secondary | ICD-10-CM | POA: Diagnosis not present

## 2020-07-02 DIAGNOSIS — N183 Chronic kidney disease, stage 3 unspecified: Secondary | ICD-10-CM | POA: Diagnosis not present

## 2020-07-02 DIAGNOSIS — I129 Hypertensive chronic kidney disease with stage 1 through stage 4 chronic kidney disease, or unspecified chronic kidney disease: Secondary | ICD-10-CM | POA: Diagnosis not present

## 2020-07-02 DIAGNOSIS — R634 Abnormal weight loss: Secondary | ICD-10-CM | POA: Diagnosis not present

## 2020-07-02 DIAGNOSIS — F329 Major depressive disorder, single episode, unspecified: Secondary | ICD-10-CM | POA: Diagnosis not present

## 2020-08-12 DIAGNOSIS — E038 Other specified hypothyroidism: Secondary | ICD-10-CM | POA: Diagnosis not present

## 2020-08-19 ENCOUNTER — Other Ambulatory Visit: Payer: Self-pay

## 2020-08-19 ENCOUNTER — Encounter: Payer: Self-pay | Admitting: Nurse Practitioner

## 2020-08-19 ENCOUNTER — Ambulatory Visit: Payer: Medicaid Other | Attending: Nurse Practitioner | Admitting: Nurse Practitioner

## 2020-08-19 VITALS — Ht 72.0 in | Wt 168.0 lb

## 2020-08-19 DIAGNOSIS — Z1211 Encounter for screening for malignant neoplasm of colon: Secondary | ICD-10-CM

## 2020-08-19 DIAGNOSIS — Z114 Encounter for screening for human immunodeficiency virus [HIV]: Secondary | ICD-10-CM | POA: Diagnosis not present

## 2020-08-19 DIAGNOSIS — Z131 Encounter for screening for diabetes mellitus: Secondary | ICD-10-CM

## 2020-08-19 DIAGNOSIS — Z1159 Encounter for screening for other viral diseases: Secondary | ICD-10-CM

## 2020-08-19 DIAGNOSIS — Z13220 Encounter for screening for lipoid disorders: Secondary | ICD-10-CM | POA: Diagnosis not present

## 2020-08-19 DIAGNOSIS — E559 Vitamin D deficiency, unspecified: Secondary | ICD-10-CM

## 2020-08-19 DIAGNOSIS — E079 Disorder of thyroid, unspecified: Secondary | ICD-10-CM

## 2020-08-19 DIAGNOSIS — F3131 Bipolar disorder, current episode depressed, mild: Secondary | ICD-10-CM

## 2020-08-19 DIAGNOSIS — Z7689 Persons encountering health services in other specified circumstances: Secondary | ICD-10-CM

## 2020-08-19 DIAGNOSIS — N1832 Chronic kidney disease, stage 3b: Secondary | ICD-10-CM | POA: Diagnosis not present

## 2020-08-19 MED ORDER — MIRTAZAPINE 45 MG PO TABS: 45.0000 mg | ORAL_TABLET | Freq: Every day | ORAL | 1 refills | Status: DC
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
## 2020-08-21 ENCOUNTER — Other Ambulatory Visit: Payer: Medicaid Other

## 2020-08-28 ENCOUNTER — Other Ambulatory Visit: Payer: Self-pay

## 2020-08-28 ENCOUNTER — Ambulatory Visit: Payer: Medicaid Other | Attending: Nurse Practitioner

## 2020-08-28 DIAGNOSIS — N1832 Chronic kidney disease, stage 3b: Secondary | ICD-10-CM

## 2020-08-28 DIAGNOSIS — Z13220 Encounter for screening for lipoid disorders: Secondary | ICD-10-CM

## 2020-08-28 DIAGNOSIS — E559 Vitamin D deficiency, unspecified: Secondary | ICD-10-CM

## 2020-08-28 DIAGNOSIS — Z1159 Encounter for screening for other viral diseases: Secondary | ICD-10-CM

## 2020-08-28 DIAGNOSIS — Z131 Encounter for screening for diabetes mellitus: Secondary | ICD-10-CM

## 2020-08-28 DIAGNOSIS — Z114 Encounter for screening for human immunodeficiency virus [HIV]: Secondary | ICD-10-CM | POA: Diagnosis not present

## 2020-08-29 LAB — CMP14+EGFR
ALT: 21 IU/L (ref 0–44)
AST: 63 IU/L — ABNORMAL HIGH (ref 0–40)
Albumin/Globulin Ratio: 2 (ref 1.2–2.2)
Albumin: 4.3 g/dL (ref 4.0–5.0)
Alkaline Phosphatase: 56 IU/L (ref 44–121)
BUN/Creatinine Ratio: 12 (ref 9–20)
BUN: 18 mg/dL (ref 6–24)
Bilirubin Total: <0.2 mg/dL (ref 0.0–1.2)
CO2: 27 mmol/L (ref 20–29)
Calcium: 9.5 mg/dL (ref 8.7–10.2)
Chloride: 104 mmol/L (ref 96–106)
Creatinine, Ser: 1.48 mg/dL — ABNORMAL HIGH (ref 0.76–1.27)
GFR calc Af Amer: 64 mL/min/{1.73_m2} (ref >59)
GFR calc non Af Amer: 56 mL/min/{1.73_m2} — ABNORMAL LOW (ref >59)
Globulin, Total: 2.2 g/dL (ref 1.5–4.5)
Glucose: 91 mg/dL (ref 65–99)
Potassium: 4 mmol/L (ref 3.5–5.2)
Sodium: 143 mmol/L (ref 134–144)
Total Protein: 6.5 g/dL (ref 6.0–8.5)

## 2020-08-29 LAB — LIPID PANEL
Chol/HDL Ratio: 3.4 ratio (ref 0.0–5.0)
Cholesterol, Total: 146 mg/dL (ref 100–199)
HDL: 43 mg/dL (ref >39)
LDL Chol Calc (NIH): 67 mg/dL (ref 0–99)
Triglycerides: 218 mg/dL — ABNORMAL HIGH (ref 0–149)
VLDL Cholesterol Cal: 36 mg/dL (ref 5–40)

## 2020-08-29 LAB — CBC WITH DIFFERENTIAL/PLATELET
Basophils Absolute: 0.1 10*3/uL (ref 0.0–0.2)
Basos: 1 %
EOS (ABSOLUTE): 0.2 10*3/uL (ref 0.0–0.4)
Eos: 3 %
Hematocrit: 33.4 % — ABNORMAL LOW (ref 37.5–51.0)
Hemoglobin: 10.7 g/dL — ABNORMAL LOW (ref 13.0–17.7)
Immature Grans (Abs): 0 10*3/uL (ref 0.0–0.1)
Immature Granulocytes: 0 %
Lymphocytes Absolute: 2.3 10*3/uL (ref 0.7–3.1)
Lymphs: 33 %
MCH: 24.3 pg — ABNORMAL LOW (ref 26.6–33.0)
MCHC: 32 g/dL (ref 31.5–35.7)
MCV: 76 fL — ABNORMAL LOW (ref 79–97)
Monocytes Absolute: 0.7 10*3/uL (ref 0.1–0.9)
Monocytes: 11 %
Neutrophils Absolute: 3.7 10*3/uL (ref 1.4–7.0)
Neutrophils: 52 %
Platelets: 244 10*3/uL (ref 150–450)
RBC: 4.4 x10E6/uL (ref 4.14–5.80)
RDW: 15.5 % — ABNORMAL HIGH (ref 11.6–15.4)
WBC: 7.1 10*3/uL (ref 3.4–10.8)

## 2020-08-29 LAB — HEMOGLOBIN A1C
Est. average glucose Bld gHb Est-mCnc: 114 mg/dL
Hgb A1c MFr Bld: 5.6 % (ref 4.8–5.6)

## 2020-08-29 LAB — HEPATITIS C ANTIBODY: Hep C Virus Ab: <0.1 s/co ratio (ref 0.0–0.9)

## 2020-08-29 LAB — HIV ANTIBODY (ROUTINE TESTING W REFLEX): HIV Screen 4th Generation wRfx: NONREACTIVE

## 2020-08-29 LAB — VITAMIN D 25 HYDROXY (VIT D DEFICIENCY, FRACTURES): Vit D, 25-Hydroxy: 41.2 ng/mL (ref 30.0–100.0)

## 2020-09-01 ENCOUNTER — Other Ambulatory Visit: Payer: Self-pay | Admitting: Nurse Practitioner

## 2020-09-01 DIAGNOSIS — D649 Anemia, unspecified: Secondary | ICD-10-CM

## 2020-09-23 IMAGING — MR MR HEAD WO/W CM
16 of 19 series · 34 of 48 positions shown · IV contrast (12 ml Multihance)
Comparison: None.

CLINICAL DATA: Central hypothyroidism. Pain, tremors, nausea,
depression, difficulty walking, and weakness.

Creatinine was obtained on site at [HOSPITAL] at [HOSPITAL].
Results: Creatinine 1.7 mg/dL.
EXAM:
MRI HEAD WITHOUT AND WITH CONTRAST
TECHNIQUE: Multiplanar, multiecho pulse sequences of the brain and surrounding
structures were obtained without and with intravenous contrast.
CONTRAST:  12mL MULTIHANCE GADOBENATE DIMEGLUMINE 529 MG/ML IV SOLN

[Series 2: T1 · sagittal · 5.0mm · 0.47mm/px · 1 of 21 slices shown]
[im 1/21]
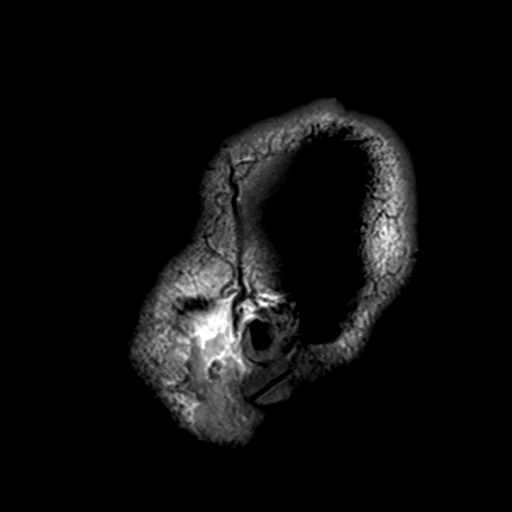

[Series 3: DWI · axial · 3.0mm · 1.88mm/px · z∈[+12,+158]mm · 8 of 100 slices shown]
[im 1/100]
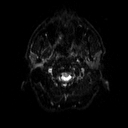
[im 12/100]
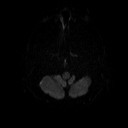
[im 34/100]
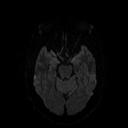
[im 45/100]
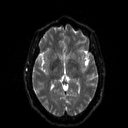
[im 56/100]
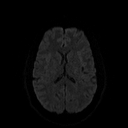
[im 67/100]
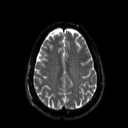
[im 89/100]
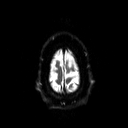
[im 100/100]
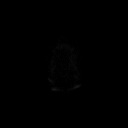

[Series 4: dwi_adc · axial · 3.0mm · 1.88mm/px · z∈[+12,+158]mm · 5 of 46 slices shown]
[im 1/46]
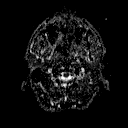
[im 12/46]
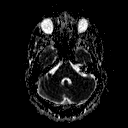
[im 23/46]
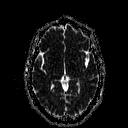
[im 34/46]
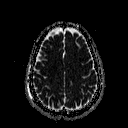
[im 46/46]
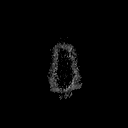

[Series 5: T2 · axial · 5.0mm · 0.38mm/px · z∈[+5,+154]mm · 2 of 24 slices shown]
[im 1/24]
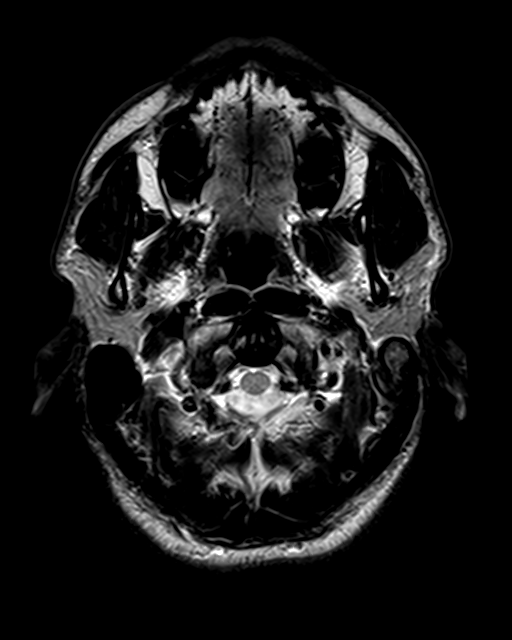
[im 24/24]
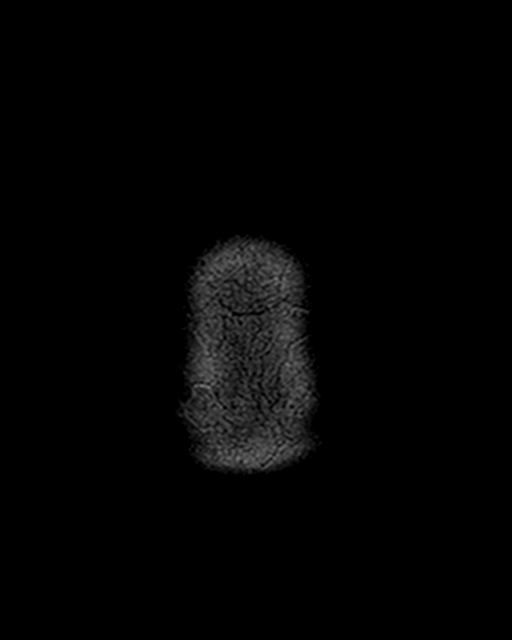

[Series 6: FLAIR · axial · 3.0mm · 0.47mm/px · z∈[+3,+156]mm · 4 of 34 slices shown]
[im 1/34]
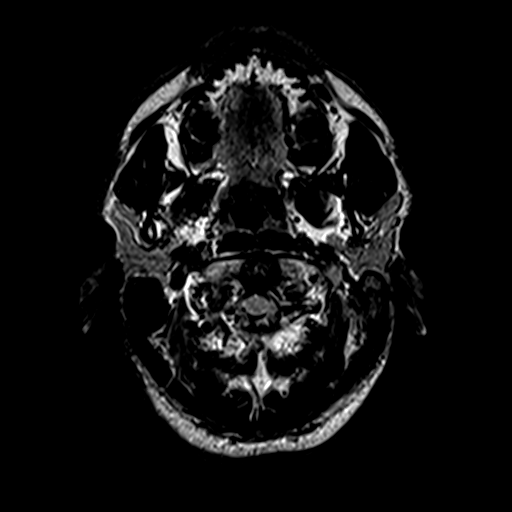
[im 12/34]
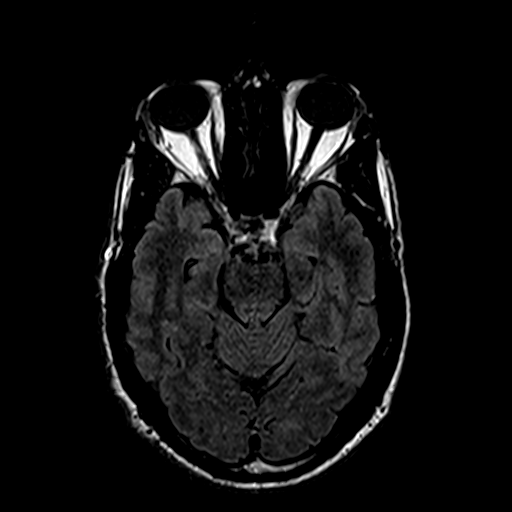
[im 23/34]
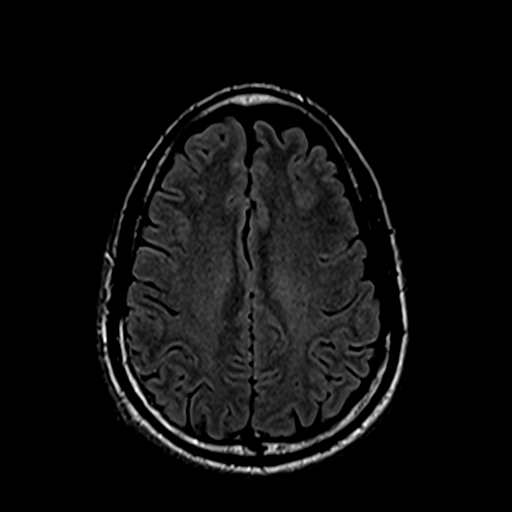
[im 34/34]
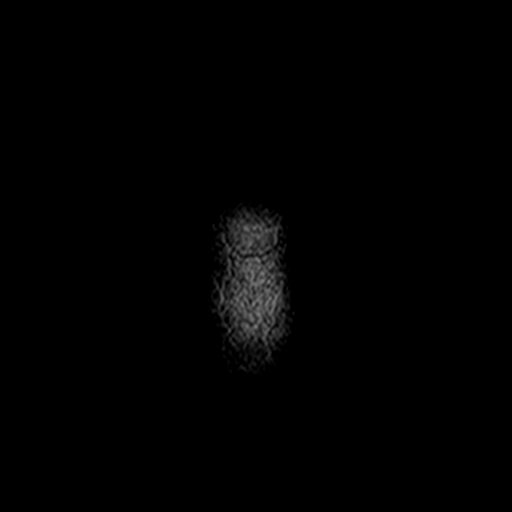

[Series 8: swi_images · axial · 4.0mm · 0.94mm/px · z∈[+2,+158]mm · 4 of 40 slices shown]
[im 1/40]
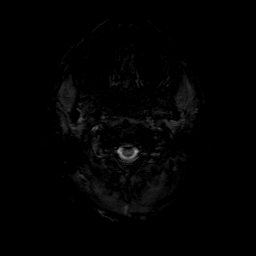
[im 14/40]
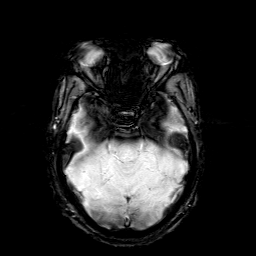
[im 27/40]
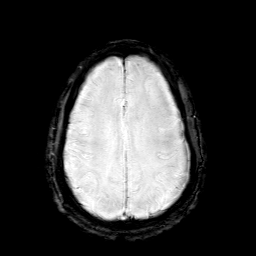
[im 40/40]
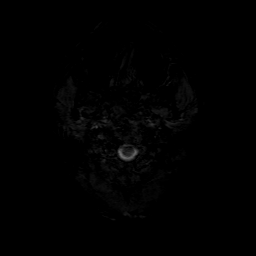

[Series 9: sag 3mm · sagittal · 3.0mm · 0.33mm/px · 1 of 11 slices shown]
[im 1/11]
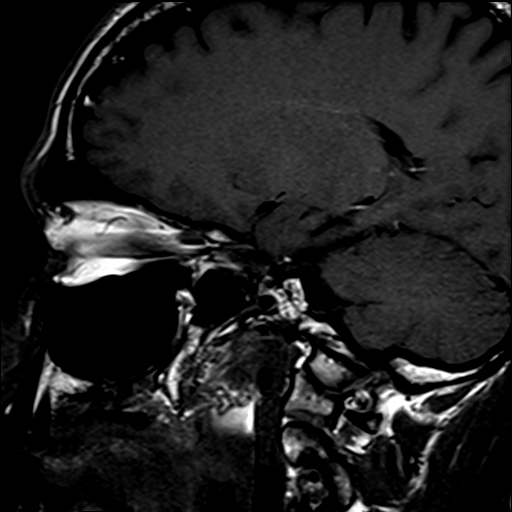

[Series 10: cor 3mm · coronal · 3.0mm · 0.33mm/px · 1 of 11 slices shown]
[im 1/11]
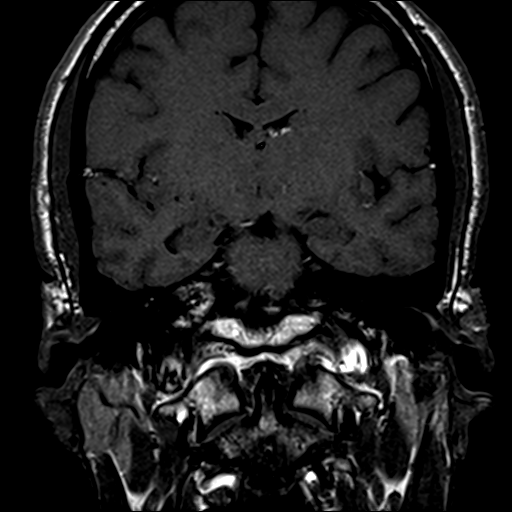

[Series 11: pre cor dynamic · coronal · non-contrast · 3.0mm · 0.35mm/px · 1 of 7 slices shown]
[im 1/7]
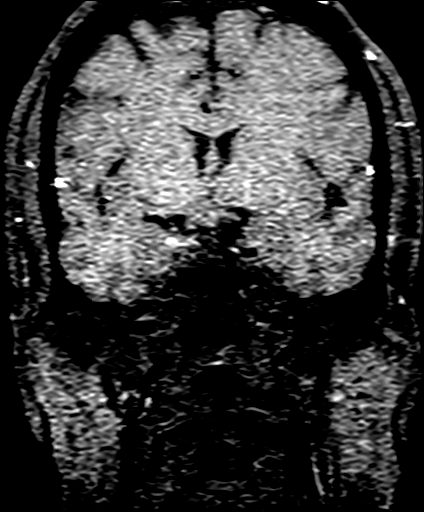

[Series 12: post fs cor · coronal · 3.0mm · 0.35mm/px · 1 of 7 slices shown (1 of 6)]
[im 1/7]
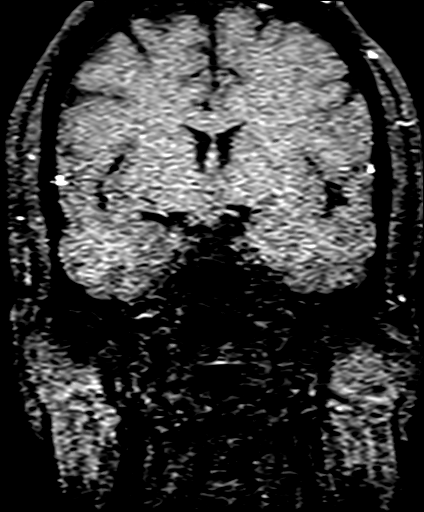

[Series 13: post fs cor · coronal · 3.0mm · 0.35mm/px · 1 of 7 slices shown (2 of 6)]
[im 1/7]
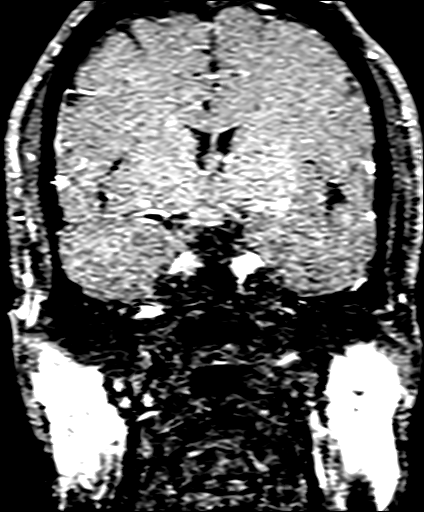

[Series 14: post fs cor · coronal · 3.0mm · 0.35mm/px · 1 of 7 slices shown (3 of 6)]
[im 1/7]
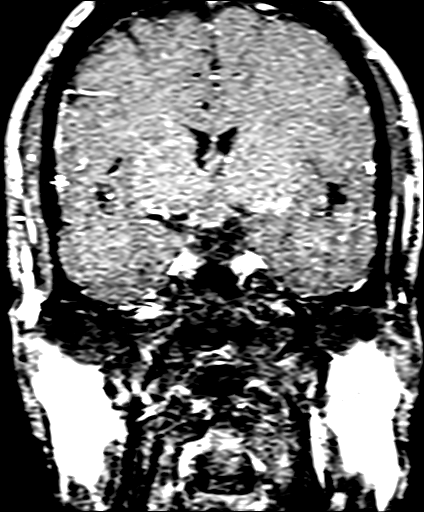

[Series 15: post fs cor · coronal · 3.0mm · 0.35mm/px · 1 of 7 slices shown (4 of 6)]
[im 1/7]
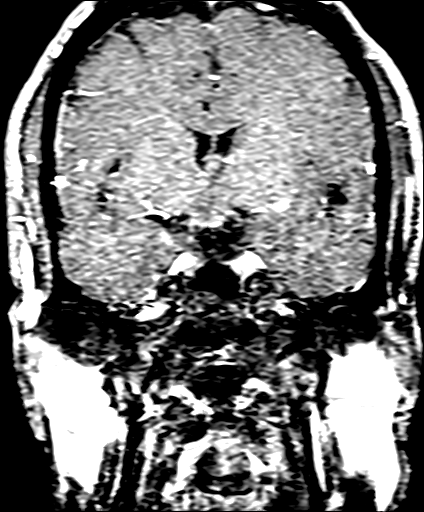

[Series 16: post fs cor · coronal · 3.0mm · 0.35mm/px · 1 of 7 slices shown (5 of 6)]
[im 1/7]
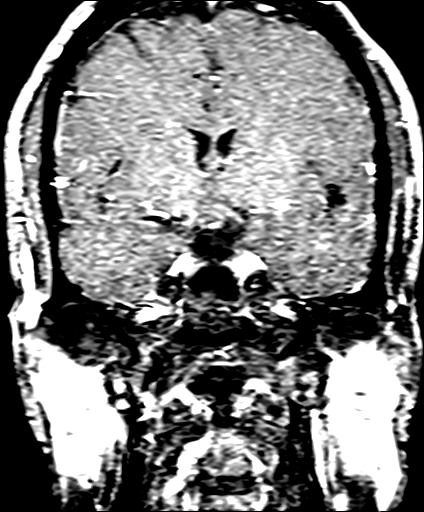

[Series 17: post fs cor · coronal · 3.0mm · 0.35mm/px · 1 of 7 slices shown (6 of 6)]
[im 1/7]
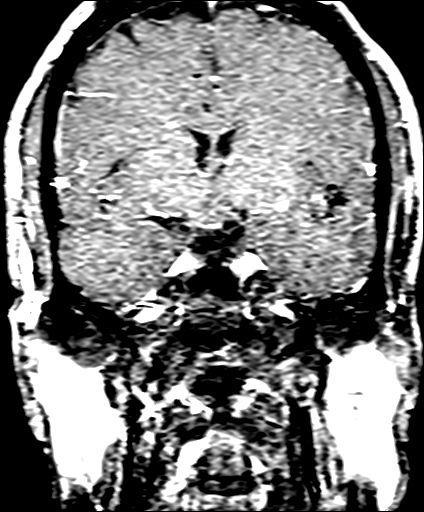

[Series 18: post sag 3mm · sagittal · 3.0mm · 0.33mm/px · 1 of 11 slices shown]
[im 1/11]
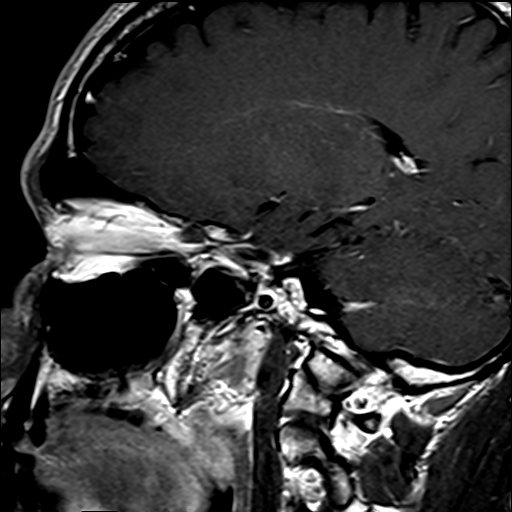

[34 of 48 positions shown; findings below may reference images not displayed]

FINDINGS: Brain: There is no evidence of acute infarct, intracranial
hemorrhage, mass, midline shift, or extra-axial fluid collection.
The ventricles and sulci are normal. The brain is normal in signal.
No abnormal enhancement is identified.

Dedicated imaging was performed through the sella turcica. The
pituitary gland is normal in size. Pituitary enhancement is
homogeneous without a discrete lesion identified. The infundibulum
is midline. The optic chiasm, cavernous sinuses, and hypothalamic
region are unremarkable.

Vascular: Major intracranial vascular flow voids are preserved.

Skull and upper cervical spine: Unremarkable bone marrow signal.

Sinuses/Orbits: Unremarkable orbits. Paranasal sinuses and mastoid
air cells are clear.

Other: None.
IMPRESSION: Unremarkable MRI of the brain and pituitary.

## 2020-09-25 ENCOUNTER — Ambulatory Visit: Payer: Medicaid Other | Admitting: Nurse Practitioner

## 2020-09-27 ENCOUNTER — Ambulatory Visit (HOSPITAL_COMMUNITY): Payer: Medicaid Other | Admitting: Licensed Clinical Social Worker

## 2020-10-01 ENCOUNTER — Ambulatory Visit (HOSPITAL_COMMUNITY): Payer: Medicaid Other | Admitting: Physician Assistant

## 2020-11-06 ENCOUNTER — Other Ambulatory Visit: Payer: Self-pay

## 2020-11-06 ENCOUNTER — Ambulatory Visit (INDEPENDENT_AMBULATORY_CARE_PROVIDER_SITE_OTHER): Payer: Medicaid Other | Admitting: Physician Assistant

## 2020-11-06 ENCOUNTER — Encounter (HOSPITAL_COMMUNITY): Payer: Self-pay | Admitting: Physician Assistant

## 2020-11-06 VITALS — BP 144/86 | HR 85 | Ht 72.0 in | Wt 171.0 lb

## 2020-11-06 DIAGNOSIS — F250 Schizoaffective disorder, bipolar type: Secondary | ICD-10-CM

## 2020-11-06 DIAGNOSIS — F411 Generalized anxiety disorder: Secondary | ICD-10-CM

## 2020-11-06 MED ORDER — QUETIAPINE FUMARATE 50 MG PO TABS: 50.0000 mg | ORAL_TABLET | Freq: Every day | ORAL | 1 refills | Status: DC

## 2020-11-06 MED ORDER — HYDROXYZINE HCL 10 MG PO TABS: 10.0000 mg | ORAL_TABLET | Freq: Three times a day (TID) | ORAL | 1 refills | Status: DC | PRN
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
## 2020-11-15 ENCOUNTER — Telehealth (HOSPITAL_COMMUNITY): Payer: Self-pay | Admitting: *Deleted

## 2020-11-15 NOTE — Telephone Encounter (Signed)
Call from [facility] to clarify dose of Seroquel was to be increased to 100 mg if he was doing ok on the 50 mg, that is wasn't an option, he was to increase. Verified her understanding it is to be increased as long as he is tolerating the 50 mg, it is not to be treated as a prn. She verbalized her understanding.

## 2020-11-20 ENCOUNTER — Ambulatory Visit: Payer: Medicaid Other | Admitting: Nurse Practitioner

## 2020-11-20 ENCOUNTER — Telehealth: Payer: Medicaid Other | Admitting: Nurse Practitioner

## 2020-11-23 ENCOUNTER — Other Ambulatory Visit: Payer: Self-pay | Admitting: Nurse Practitioner

## 2020-11-23 DIAGNOSIS — F3131 Bipolar disorder, current episode depressed, mild: Secondary | ICD-10-CM

## 2020-12-09 ENCOUNTER — Other Ambulatory Visit: Payer: Self-pay | Admitting: Nurse Practitioner

## 2020-12-09 DIAGNOSIS — F3131 Bipolar disorder, current episode depressed, mild: Secondary | ICD-10-CM

## 2020-12-11 ENCOUNTER — Telehealth (HOSPITAL_COMMUNITY): Payer: Self-pay | Admitting: *Deleted

## 2020-12-11 ENCOUNTER — Other Ambulatory Visit (HOSPITAL_COMMUNITY): Payer: Self-pay | Admitting: Physician Assistant

## 2020-12-11 DIAGNOSIS — F250 Schizoaffective disorder, bipolar type: Secondary | ICD-10-CM

## 2020-12-11 MED ORDER — QUETIAPINE FUMARATE 50 MG PO TABS: 50.0000 mg | ORAL_TABLET | Freq: Every day | ORAL | 1 refills | Status: DC

## 2020-12-11 NOTE — Telephone Encounter (Signed)
Provider was contacted by Direce E McIntyre, RMA regarding refill request. Patient's medication will be e-prescribed to pharmacy of choice. 

## 2020-12-11 NOTE — Progress Notes (Signed)
Provider was contacted by Direce E McIntyre, RMA regarding refill request. Patient's medication will be e-prescribed to pharmacy of choice. 

## 2020-12-11 NOTE — Telephone Encounter (Signed)
Rx REFILL REQUEST  QUEtiapine (SEROQUEL) 50 MG tablet 30 tablet 1 11/06/2020 11/06/2021

## 2020-12-17 ENCOUNTER — Encounter (HOSPITAL_COMMUNITY): Payer: Medicaid Other | Admitting: Physician Assistant

## 2020-12-20 ENCOUNTER — Ambulatory Visit: Payer: Medicaid Other | Attending: Nurse Practitioner | Admitting: Nurse Practitioner

## 2020-12-20 ENCOUNTER — Other Ambulatory Visit: Payer: Self-pay

## 2020-12-20 ENCOUNTER — Encounter: Payer: Self-pay | Admitting: Nurse Practitioner

## 2020-12-20 VITALS — BP 135/84 | HR 106 | Temp 99.0°F | Resp 16 | Wt 172.0 lb

## 2020-12-20 DIAGNOSIS — Z1211 Encounter for screening for malignant neoplasm of colon: Secondary | ICD-10-CM | POA: Diagnosis not present

## 2020-12-20 DIAGNOSIS — Z8679 Personal history of other diseases of the circulatory system: Secondary | ICD-10-CM | POA: Diagnosis not present

## 2020-12-20 DIAGNOSIS — R7989 Other specified abnormal findings of blood chemistry: Secondary | ICD-10-CM | POA: Diagnosis not present

## 2020-12-20 DIAGNOSIS — F3131 Bipolar disorder, current episode depressed, mild: Secondary | ICD-10-CM

## 2020-12-20 MED ORDER — MIRTAZAPINE 45 MG PO TABS: 45.0000 mg | ORAL_TABLET | Freq: Every day | ORAL | 1 refills | Status: DC
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
## 2020-12-20 NOTE — Progress Notes (Signed)
Discuss BP

## 2020-12-21 LAB — CMP14+EGFR
ALT: 8 IU/L (ref 0–44)
AST: 16 IU/L (ref 0–40)
Albumin/Globulin Ratio: 1.9 (ref 1.2–2.2)
Albumin: 4.6 g/dL (ref 4.0–5.0)
Alkaline Phosphatase: 54 IU/L (ref 44–121)
BUN/Creatinine Ratio: 10 (ref 9–20)
BUN: 13 mg/dL (ref 6–24)
Bilirubin Total: 0.2 mg/dL (ref 0.0–1.2)
CO2: 22 mmol/L (ref 20–29)
Calcium: 9.6 mg/dL (ref 8.7–10.2)
Chloride: 104 mmol/L (ref 96–106)
Creatinine, Ser: 1.35 mg/dL — ABNORMAL HIGH (ref 0.76–1.27)
GFR calc Af Amer: 72 mL/min/{1.73_m2} (ref >59)
GFR calc non Af Amer: 62 mL/min/{1.73_m2} (ref >59)
Globulin, Total: 2.4 g/dL (ref 1.5–4.5)
Glucose: 123 mg/dL — ABNORMAL HIGH (ref 65–99)
Potassium: 4 mmol/L (ref 3.5–5.2)
Sodium: 141 mmol/L (ref 134–144)
Total Protein: 7 g/dL (ref 6.0–8.5)

## 2020-12-24 ENCOUNTER — Other Ambulatory Visit: Payer: Self-pay

## 2020-12-24 ENCOUNTER — Encounter (HOSPITAL_COMMUNITY): Payer: Self-pay | Admitting: Physician Assistant

## 2020-12-24 ENCOUNTER — Telehealth (INDEPENDENT_AMBULATORY_CARE_PROVIDER_SITE_OTHER): Payer: Medicaid Other | Admitting: Physician Assistant

## 2020-12-24 DIAGNOSIS — F411 Generalized anxiety disorder: Secondary | ICD-10-CM

## 2020-12-24 DIAGNOSIS — F250 Schizoaffective disorder, bipolar type: Secondary | ICD-10-CM | POA: Diagnosis not present

## 2020-12-24 MED ORDER — QUETIAPINE FUMARATE 200 MG PO TABS: 200.0000 mg | ORAL_TABLET | Freq: Every day | ORAL | 1 refills | Status: DC

## 2020-12-24 MED ORDER — HYDROXYZINE HCL 25 MG PO TABS: 25.0000 mg | ORAL_TABLET | Freq: Three times a day (TID) | ORAL | 1 refills | Status: DC | PRN
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
## 2021-01-01 ENCOUNTER — Ambulatory Visit (HOSPITAL_COMMUNITY): Payer: Medicaid Other | Admitting: Licensed Clinical Social Worker

## 2021-01-07 ENCOUNTER — Ambulatory Visit: Payer: Medicaid Other | Admitting: Gastroenterology

## 2021-01-22 ENCOUNTER — Telehealth (HOSPITAL_COMMUNITY): Payer: Medicaid Other | Admitting: Physician Assistant

## 2021-02-03 ENCOUNTER — Telehealth (HOSPITAL_COMMUNITY): Payer: Self-pay | Admitting: *Deleted

## 2021-02-03 NOTE — Telephone Encounter (Signed)
Call from patients wife, she reports she feels he is more agitated with the increase in the Seroquel and not better in re to the hallucinations which is why it was increased from 100 mg. Told her to drop it back to 100 mg for now till can speak with the provider who does not work in the office on mondays. While speaking with her I checked his next appt and he is scheduled for tomorrow at 9.She asked it be changed to in person.

## 2021-02-04 ENCOUNTER — Other Ambulatory Visit: Payer: Self-pay

## 2021-02-04 ENCOUNTER — Ambulatory Visit (INDEPENDENT_AMBULATORY_CARE_PROVIDER_SITE_OTHER): Payer: Medicaid Other | Admitting: Physician Assistant

## 2021-02-04 ENCOUNTER — Encounter (HOSPITAL_COMMUNITY): Payer: Self-pay | Admitting: Physician Assistant

## 2021-02-04 ENCOUNTER — Telehealth: Payer: Self-pay | Admitting: Nurse Practitioner

## 2021-02-04 VITALS — BP 125/93 | HR 80 | Ht 72.0 in | Wt 168.4 lb

## 2021-02-04 DIAGNOSIS — F411 Generalized anxiety disorder: Secondary | ICD-10-CM | POA: Diagnosis not present

## 2021-02-04 DIAGNOSIS — F250 Schizoaffective disorder, bipolar type: Secondary | ICD-10-CM

## 2021-02-04 MED ORDER — LAMOTRIGINE 25 MG PO TABS: 50.0000 mg | ORAL_TABLET | Freq: Every day | ORAL | 1 refills | Status: DC

## 2021-02-04 MED ORDER — BUSPIRONE HCL 7.5 MG PO TABS: 7.5000 mg | ORAL_TABLET | Freq: Two times a day (BID) | ORAL | 1 refills | Status: DC

## 2021-02-04 MED ORDER — LAMOTRIGINE 25 MG PO TABS: 25.0000 mg | ORAL_TABLET | Freq: Every day | ORAL | 0 refills | Status: DC

## 2021-02-04 MED ORDER — QUETIAPINE FUMARATE 200 MG PO TABS: 200.0000 mg | ORAL_TABLET | Freq: Every day | ORAL | 1 refills | Status: DC

## 2021-02-04 MED ORDER — HYDROXYZINE HCL 25 MG PO TABS: 25.0000 mg | ORAL_TABLET | Freq: Three times a day (TID) | ORAL | 1 refills | Status: DC | PRN
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
## 2021-02-04 NOTE — Telephone Encounter (Signed)
Copied from CRM [id number]. Topic: General - Call Back - No Documentation >> Feb 04, 2021 12:14 PM Randol Kern wrote: Kevin Haley called stating:   "Pt had an appt with the psych doctor today, the Remeron that he is taking they would like to wean him off of that so they can give him something else"   Best contact: [phone]   I'm not sure if this is just an FYI or if patient needs an appt to discuss. Please advise.

## 2021-02-05 NOTE — Telephone Encounter (Signed)
Kevin Haley called again about her message from yesterday and wanted to know if there was any word on this/ she sated that Dr. Elsie Saas Pakaur advised the pt to get with Zelda about him tapering off of Remeron and starting a new medication due to the Remeron no longer working / Pt wanted to speak with Zelda before starting his new meds this evening / please advise

## 2021-02-05 NOTE — Telephone Encounter (Signed)
Will route to PCP for review. 

## 2021-02-09 ENCOUNTER — Other Ambulatory Visit: Payer: Self-pay | Admitting: Nurse Practitioner

## 2021-02-09 DIAGNOSIS — F3131 Bipolar disorder, current episode depressed, mild: Secondary | ICD-10-CM

## 2021-02-09 MED ORDER — MIRTAZAPINE 30 MG PO TABS: 30.0000 mg | ORAL_TABLET | Freq: Every day | ORAL | 0 refills | Status: DC

## 2021-02-09 NOTE — Telephone Encounter (Signed)
Pick up 30 mg remeron tablet. Take 1/2 tablet daily or one week. Second week take 1/2 tablet every other day. Week 3 take 1/2 tablet every 3 days then stop on day 7

## 2021-02-10 ENCOUNTER — Other Ambulatory Visit: Payer: Self-pay | Admitting: Nurse Practitioner

## 2021-02-10 DIAGNOSIS — F3131 Bipolar disorder, current episode depressed, mild: Secondary | ICD-10-CM

## 2021-02-10 MED ORDER — MIRTAZAPINE 30 MG PO TABS: ORAL_TABLET | ORAL | 0 refills | Status: DC

## 2021-02-10 NOTE — Telephone Encounter (Signed)
Kevin Haley has been instructed regarding weaning off remeron. He verbalized understanding during phone call today

## 2021-03-19 ENCOUNTER — Encounter (HOSPITAL_COMMUNITY): Payer: Self-pay | Admitting: Physician Assistant

## 2021-03-19 ENCOUNTER — Other Ambulatory Visit: Payer: Self-pay

## 2021-03-19 ENCOUNTER — Telehealth (INDEPENDENT_AMBULATORY_CARE_PROVIDER_SITE_OTHER): Payer: Medicaid Other | Admitting: Physician Assistant

## 2021-03-19 DIAGNOSIS — F250 Schizoaffective disorder, bipolar type: Secondary | ICD-10-CM | POA: Diagnosis not present

## 2021-03-19 DIAGNOSIS — F411 Generalized anxiety disorder: Secondary | ICD-10-CM

## 2021-03-19 MED ORDER — QUETIAPINE FUMARATE 200 MG PO TABS
200.0000 mg | ORAL_TABLET | Freq: Every day | ORAL | 1 refills | Status: DC
Start: 2021-03-19 — End: 2021-04-25

## 2021-03-19 MED ORDER — LAMOTRIGINE 25 MG PO TABS
50.0000 mg | ORAL_TABLET | Freq: Every day | ORAL | 1 refills | Status: DC
Start: 2021-03-19 — End: 2021-05-06

## 2021-03-19 MED ORDER — HYDROXYZINE HCL 25 MG PO TABS: 25.0000 mg | ORAL_TABLET | Freq: Three times a day (TID) | ORAL | 1 refills | Status: AC | PRN

## 2021-03-19 MED ORDER — BUSPIRONE HCL 7.5 MG PO TABS
7.5000 mg | ORAL_TABLET | Freq: Two times a day (BID) | ORAL | 1 refills | Status: DC
Start: 2021-03-19 — End: 2021-04-25
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
## 2021-03-25 ENCOUNTER — Ambulatory Visit (HOSPITAL_COMMUNITY): Payer: Medicaid Other | Admitting: Licensed Clinical Social Worker

## 2021-04-24 ENCOUNTER — Telehealth (HOSPITAL_COMMUNITY): Payer: Self-pay | Admitting: *Deleted

## 2021-04-24 NOTE — Telephone Encounter (Signed)
Refill Request Rx: QUEtiapine (SEROQUEL) 200 MG tablet  &  busPIRone (BUSPAR) 7.5 MG tablet.

## 2021-04-25 ENCOUNTER — Other Ambulatory Visit (HOSPITAL_COMMUNITY): Payer: Self-pay | Admitting: Physician Assistant

## 2021-04-25 DIAGNOSIS — F411 Generalized anxiety disorder: Secondary | ICD-10-CM

## 2021-04-25 DIAGNOSIS — F250 Schizoaffective disorder, bipolar type: Secondary | ICD-10-CM

## 2021-04-25 MED ORDER — BUSPIRONE HCL 7.5 MG PO TABS: 7.5000 mg | ORAL_TABLET | Freq: Two times a day (BID) | ORAL | 1 refills | Status: AC

## 2021-04-25 MED ORDER — QUETIAPINE FUMARATE 200 MG PO TABS: 200.0000 mg | ORAL_TABLET | Freq: Every day | ORAL | 1 refills | Status: DC

## 2021-04-25 NOTE — Telephone Encounter (Signed)
Provider was contacted by Direce E McIntyre, RMA regarding medication refill requests. Patient's medications to be e-prescribed to pharmacy of choice. 

## 2021-04-25 NOTE — Progress Notes (Signed)
Provider was contacted by Rushie Chestnut, RMA regarding medication refill requests. Patient's medications to be e-prescribed to pharmacy of choice.

## 2021-05-05 ENCOUNTER — Telehealth (HOSPITAL_COMMUNITY): Payer: Self-pay | Admitting: *Deleted

## 2021-05-05 NOTE — Telephone Encounter (Signed)
Rx Refill Request lamoTRIgine (LAMICTAL) 25 MG tablet

## 2021-05-06 ENCOUNTER — Other Ambulatory Visit (HOSPITAL_COMMUNITY): Payer: Self-pay | Admitting: Physician Assistant

## 2021-05-06 DIAGNOSIS — F250 Schizoaffective disorder, bipolar type: Secondary | ICD-10-CM

## 2021-05-06 MED ORDER — LAMOTRIGINE 25 MG PO TABS: 50.0000 mg | ORAL_TABLET | Freq: Every day | ORAL | 1 refills | Status: DC

## 2021-05-06 NOTE — Progress Notes (Signed)
Provider was contacted by Direce E McIntyre, RMA regarding medication refill. Patient's medication to be e-prescribed to pharmacy of choice.

## 2021-05-06 NOTE — Telephone Encounter (Signed)
Provider was contacted by Direce E McIntyre, RMA regarding medication refill. Patient's medication to be e-prescribed to pharmacy of choice.

## 2021-05-15 ENCOUNTER — Ambulatory Visit: Payer: Medicaid Other | Admitting: Critical Care Medicine

## 2021-05-21 ENCOUNTER — Telehealth (HOSPITAL_COMMUNITY): Payer: Medicaid Other | Admitting: Physician Assistant

## 2021-05-21 ENCOUNTER — Other Ambulatory Visit: Payer: Self-pay

## 2021-06-17 ENCOUNTER — Ambulatory Visit: Payer: Medicaid Other | Admitting: Nurse Practitioner

## 2021-06-19 ENCOUNTER — Ambulatory Visit: Payer: Self-pay | Admitting: Critical Care Medicine
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
## 2021-06-23 ENCOUNTER — Other Ambulatory Visit: Payer: Self-pay

## 2021-06-23 ENCOUNTER — Ambulatory Visit: Payer: Medicaid Other | Attending: Critical Care Medicine | Admitting: Critical Care Medicine

## 2021-06-23 ENCOUNTER — Encounter: Payer: Self-pay | Admitting: Critical Care Medicine

## 2021-06-23 VITALS — BP 146/82 | HR 65 | Temp 98.4°F | Ht 72.0 in | Wt 116.2 lb

## 2021-06-23 DIAGNOSIS — L739 Follicular disorder, unspecified: Secondary | ICD-10-CM | POA: Insufficient documentation

## 2021-06-23 DIAGNOSIS — L700 Acne vulgaris: Secondary | ICD-10-CM

## 2021-06-23 MED ORDER — DOXYCYCLINE HYCLATE 100 MG PO TABS: 100.0000 mg | ORAL_TABLET | Freq: Two times a day (BID) | ORAL | 0 refills | Status: AC

## 2021-06-23 MED ORDER — CLINDAMYCIN PHOS-BENZOYL PEROX 1-5 % EX GEL: Freq: Two times a day (BID) | CUTANEOUS | 0 refills | Status: DC
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
## 2021-06-23 NOTE — Assessment & Plan Note (Deleted)
Solitary epidermal nodule resembles open comedone with dilated skin pore. Plan to treat with topical benzol- peroxide and oral doxycycline 1 tab BID x 7 days.  

## 2021-06-23 NOTE — Assessment & Plan Note (Signed)
Solitary epidermal nodule resembles open comedone with dilated skin pore. Plan to treat with topical benzol- peroxide and oral doxycycline 1 tab BID x 7 days.

## 2021-06-23 NOTE — Patient Instructions (Signed)
Begin BenzaClin gel apply twice a day to the upper back in the middle section of your back for the acne has developed, your wife can apply this for you  Begin doxycycline orally twice daily for 7 days for skin infection in the middle of her back  Follow a healthy diet as attached to reduce the salt intake in your diet  Return to see your primary care provider next scheduled appointment in the next 3 months

## 2021-06-25 ENCOUNTER — Other Ambulatory Visit: Payer: Self-pay

## 2021-06-25 ENCOUNTER — Telehealth: Payer: Self-pay

## 2021-06-25 NOTE — Telephone Encounter (Signed)
If appropriate, can the Benzaclin gel be changed to any of these preferred medications:

## 2021-06-26 ENCOUNTER — Other Ambulatory Visit: Payer: Self-pay | Admitting: Pharmacist

## 2021-06-26 ENCOUNTER — Telehealth: Payer: Self-pay | Admitting: Nurse Practitioner

## 2021-06-26 MED ORDER — CLINDAMYCIN PHOSPHATE 1 % EX LOTN: TOPICAL_LOTION | Freq: Two times a day (BID) | CUTANEOUS | 0 refills | Status: DC

## 2021-06-26 MED ORDER — CLINDAMYCIN PHOSPHATE 1 % EX SOLN: Freq: Two times a day (BID) | CUTANEOUS | 1 refills | Status: AC

## 2021-06-26 NOTE — Telephone Encounter (Signed)
Copied from CRM [id number]. Topic: General - Other >> Jun 26, 2021 11:02 AM Gaetana Michaelis A wrote: Patient's pharmacy has called about a prior authorization for patient's  clindamycin (CLEOCIN-T) 1 % external solution prescription  Please contact further when possible

## 2021-06-26 NOTE — Telephone Encounter (Signed)
Rx changed to Cleocin T

## 2021-07-10 ENCOUNTER — Ambulatory Visit: Payer: Self-pay | Admitting: Physician Assistant

## 2021-07-31 ENCOUNTER — Ambulatory Visit: Payer: Self-pay | Admitting: Critical Care Medicine

## 2021-09-23 ENCOUNTER — Other Ambulatory Visit: Payer: Self-pay | Admitting: Critical Care Medicine

## 2021-09-23 NOTE — Telephone Encounter (Signed)
Competed course. Refill not appropriate.Sent via [employer]

## 2021-10-18 ENCOUNTER — Emergency Department (HOSPITAL_COMMUNITY): Payer: Medicaid Other

## 2021-10-18 ENCOUNTER — Other Ambulatory Visit: Payer: Self-pay

## 2021-10-18 ENCOUNTER — Emergency Department (HOSPITAL_COMMUNITY)
Admission: EM | Admit: 2021-10-18 | Discharge: 2021-10-18 | Disposition: A | Discharge status: Home / Self Care | Payer: Medicaid Other | Attending: Emergency Medicine | Admitting: Emergency Medicine

## 2021-10-18 DIAGNOSIS — Z5321 Procedure and treatment not carried out due to patient leaving prior to being seen by health care provider: Secondary | ICD-10-CM | POA: Diagnosis not present

## 2021-10-18 DIAGNOSIS — R55 Syncope and collapse: Secondary | ICD-10-CM | POA: Insufficient documentation
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
## 2021-10-18 NOTE — ED Provider Notes (Addendum)
Emergency Medicine Provider Triage Evaluation Note  Kevin Haley , a 50 y.o. male  was evaluated in triage.  Pt complains of syncope.  Pt reports he stood up to go to the bathroom and passed out. Pt reports he has a history of kidney disease   Review of Systems  Positive: syncope Negative: fever  Physical Exam  BP (!) 186/90 (BP Location: Right Arm)   Pulse 90   Temp 98 F (36.7 C) (Oral)   Resp 18   SpO2 100%  Gen:   Awake, no distress   Resp:  Normal effort  MSK:   Moves extremities without difficulty  Other:    Medical Decision Making  Medically screening exam initiated at 1:56 PM.  Appropriate orders placed.  Kevin Haley was informed that the remainder of the evaluation will be completed by another provider, this initial triage assessment does not replace that evaluation, and the importance of remaining in the ED until their evaluation is complete.     Kevin Areas, PA-C 10/18/21 1356    Kevin Haley 10/18/21 2200    Kevin Fines, MD 10/18/21 2252

## 2021-10-18 NOTE — ED Notes (Signed)
Per CT tech, patient reports he is leaving and turned in patient labels.

## 2021-10-22 ENCOUNTER — Telehealth: Payer: Self-pay | Admitting: Nurse Practitioner

## 2021-10-22 NOTE — Telephone Encounter (Signed)
Kevin Haley will be out the office 12/21. Left vm for patient to call [phone] to reschedule.

## 2021-10-29 ENCOUNTER — Encounter: Payer: Medicaid Other | Admitting: Nurse Practitioner

## 2022-02-02 ENCOUNTER — Other Ambulatory Visit (HOSPITAL_COMMUNITY): Payer: Self-pay | Admitting: Physician Assistant

## 2022-02-02 DIAGNOSIS — F250 Schizoaffective disorder, bipolar type: Secondary | ICD-10-CM

## 2022-02-03 ENCOUNTER — Encounter (HOSPITAL_COMMUNITY): Payer: Medicaid Other | Admitting: Physician Assistant

## 2022-02-03 ENCOUNTER — Encounter (HOSPITAL_COMMUNITY): Payer: Self-pay

## 2022-06-04 ENCOUNTER — Other Ambulatory Visit (HOSPITAL_COMMUNITY): Payer: Self-pay | Admitting: Physician Assistant

## 2022-06-04 DIAGNOSIS — F411 Generalized anxiety disorder: Secondary | ICD-10-CM

## 2022-11-17 ENCOUNTER — Encounter (HOSPITAL_COMMUNITY): Payer: Self-pay | Admitting: Physician Assistant

## 2022-11-17 ENCOUNTER — Ambulatory Visit (INDEPENDENT_AMBULATORY_CARE_PROVIDER_SITE_OTHER): Payer: Medicaid Other | Admitting: Physician Assistant

## 2022-11-17 DIAGNOSIS — F4310 Post-traumatic stress disorder, unspecified: Secondary | ICD-10-CM | POA: Diagnosis not present

## 2022-11-17 DIAGNOSIS — F411 Generalized anxiety disorder: Secondary | ICD-10-CM

## 2022-11-17 DIAGNOSIS — F250 Schizoaffective disorder, bipolar type: Secondary | ICD-10-CM | POA: Diagnosis not present

## 2022-11-17 MED ORDER — BUSPIRONE HCL 10 MG PO TABS: 10.0000 mg | ORAL_TABLET | Freq: Two times a day (BID) | ORAL | 1 refills | Status: DC

## 2022-11-17 MED ORDER — QUETIAPINE FUMARATE 50 MG PO TABS: ORAL_TABLET | ORAL | 0 refills | Status: DC

## 2022-11-17 MED ORDER — QUETIAPINE FUMARATE 100 MG PO TABS: 100.0000 mg | ORAL_TABLET | Freq: Every day | ORAL | 1 refills | Status: DC

## 2022-11-17 MED ORDER — SERTRALINE HCL 50 MG PO TABS: 50.0000 mg | ORAL_TABLET | Freq: Every day | ORAL | 1 refills | Status: DC
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
## 2022-12-09 ENCOUNTER — Ambulatory Visit (HOSPITAL_COMMUNITY): Payer: Medicaid Other | Admitting: Mental Health

## 2022-12-17 ENCOUNTER — Other Ambulatory Visit (HOSPITAL_COMMUNITY): Payer: Self-pay | Admitting: Physician Assistant

## 2022-12-17 DIAGNOSIS — F4310 Post-traumatic stress disorder, unspecified: Secondary | ICD-10-CM

## 2022-12-17 DIAGNOSIS — F250 Schizoaffective disorder, bipolar type: Secondary | ICD-10-CM

## 2022-12-17 DIAGNOSIS — F411 Generalized anxiety disorder: Secondary | ICD-10-CM

## 2022-12-18 ENCOUNTER — Other Ambulatory Visit (HOSPITAL_COMMUNITY): Payer: Self-pay | Admitting: Physician Assistant

## 2022-12-18 DIAGNOSIS — F411 Generalized anxiety disorder: Secondary | ICD-10-CM

## 2022-12-18 DIAGNOSIS — F250 Schizoaffective disorder, bipolar type: Secondary | ICD-10-CM

## 2022-12-18 NOTE — Telephone Encounter (Signed)
Patient to continue taking seroquel 100 mg at bedtime.

## 2022-12-22 ENCOUNTER — Ambulatory Visit (INDEPENDENT_AMBULATORY_CARE_PROVIDER_SITE_OTHER): Payer: Medicaid Other | Admitting: Physician Assistant

## 2022-12-22 ENCOUNTER — Encounter (HOSPITAL_COMMUNITY): Payer: Self-pay | Admitting: Physician Assistant

## 2022-12-22 DIAGNOSIS — F411 Generalized anxiety disorder: Secondary | ICD-10-CM

## 2022-12-22 DIAGNOSIS — F4310 Post-traumatic stress disorder, unspecified: Secondary | ICD-10-CM | POA: Diagnosis not present

## 2022-12-22 DIAGNOSIS — F250 Schizoaffective disorder, bipolar type: Secondary | ICD-10-CM | POA: Diagnosis not present

## 2022-12-22 MED ORDER — QUETIAPINE FUMARATE 100 MG PO TABS: 100.0000 mg | ORAL_TABLET | Freq: Every day | ORAL | 2 refills | Status: DC

## 2022-12-22 MED ORDER — BUSPIRONE HCL 15 MG PO TABS: 15.0000 mg | ORAL_TABLET | Freq: Two times a day (BID) | ORAL | 2 refills | Status: AC
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
## 2022-12-23 MED ORDER — SERTRALINE HCL 50 MG PO TABS: 50.0000 mg | ORAL_TABLET | Freq: Every day | ORAL | 1 refills | Status: AC

## 2022-12-30 ENCOUNTER — Telehealth (HOSPITAL_COMMUNITY): Payer: Self-pay | Admitting: Physician Assistant

## 2023-01-01 ENCOUNTER — Telehealth (HOSPITAL_COMMUNITY): Payer: Self-pay | Admitting: Physician Assistant

## 2023-01-01 NOTE — Telephone Encounter (Signed)
Patients wife called again in regards to the previous message that was sent. She is wanting to speak with you about his medication.

## 2023-01-04 ENCOUNTER — Ambulatory Visit (HOSPITAL_COMMUNITY): Payer: Medicaid Other | Admitting: Mental Health

## 2023-01-04 ENCOUNTER — Encounter (HOSPITAL_COMMUNITY): Payer: Self-pay

## 2023-01-04 ENCOUNTER — Telehealth (HOSPITAL_COMMUNITY): Payer: Self-pay | Admitting: Mental Health

## 2023-01-04 NOTE — Telephone Encounter (Signed)
Therapist sent link to connect to [facility] for tele-assessment. No response after x 10 minutes. Contacted pt via telephone. No answer; left HIPAA compliant voicemail.

## 2023-01-05 ENCOUNTER — Other Ambulatory Visit (HOSPITAL_COMMUNITY): Payer: Self-pay | Admitting: Physician Assistant

## 2023-01-05 DIAGNOSIS — F250 Schizoaffective disorder, bipolar type: Secondary | ICD-10-CM

## 2023-01-05 MED ORDER — QUETIAPINE FUMARATE 200 MG PO TABS: 200.0000 mg | ORAL_TABLET | Freq: Every day | ORAL | 1 refills | Status: AC

## 2023-01-05 NOTE — Progress Notes (Signed)
Provider was contacted by Nicola Girt regarding patient's request to speak to this provider.  Provider was able to reach out to patient to discuss concerns.  It was brought to the attention of this provider that the patient has been experiencing instances of irritability during the day.  He states that the changes in his mood were brought to his attention by his wife and believes that he has been having mood swings/instances of irritability more than 2 times a day.  Patient is currently taking Seroquel 100 mg at bedtime for the management of his schizoaffective disorder (bipolar type).  Provider recommended increasing patient's Seroquel from 100 mg to 200 mg at bedtime for mood stability.  Patient was agreeable to recommendation.  Patient was instructed to take 2 tablets (100 mg each) of the Seroquel until he is able to get his new prescription of Seroquel 200 mg at bedtime.  Patient vocalized understanding.

## 2023-01-07 NOTE — Telephone Encounter (Signed)
Message acknowledged and reviewed.

## 2023-01-07 NOTE — Telephone Encounter (Signed)
Message acknowledged and reviewed. Provider was able to reach out to patient.

## 2023-02-09 ENCOUNTER — Encounter (HOSPITAL_COMMUNITY): Payer: Medicaid Other | Admitting: Physician Assistant

## 2023-02-16 ENCOUNTER — Encounter (HOSPITAL_COMMUNITY): Payer: Medicaid Other | Admitting: Physician Assistant

## 2023-10-19 ENCOUNTER — Encounter (INDEPENDENT_AMBULATORY_CARE_PROVIDER_SITE_OTHER): Payer: Self-pay | Admitting: Otolaryngology

## 2023-12-22 ENCOUNTER — Telehealth: Payer: Self-pay | Admitting: Otolaryngology

## 2023-12-22 NOTE — Telephone Encounter (Signed)
Called to confirm appt, left a vm

## 2023-12-23 ENCOUNTER — Institutional Professional Consult (permissible substitution) (INDEPENDENT_AMBULATORY_CARE_PROVIDER_SITE_OTHER): Payer: MEDICAID | Admitting: Otolaryngology
